# Patient Record
Sex: Male | Born: 1935 | Race: Asian | Hispanic: No | Marital: Married | Smoking: Never smoker
Health system: Southern US, Community
[De-identification: ages and names within clinical notes are randomized; demographics above are authoritative.]

## PROBLEM LIST (undated history)

## (undated) DIAGNOSIS — F0390 Unspecified dementia without behavioral disturbance: Secondary | ICD-10-CM

## (undated) DIAGNOSIS — E785 Hyperlipidemia, unspecified: Secondary | ICD-10-CM

## (undated) DIAGNOSIS — K219 Gastro-esophageal reflux disease without esophagitis: Secondary | ICD-10-CM

## (undated) DIAGNOSIS — R7401 Elevation of levels of liver transaminase levels: Secondary | ICD-10-CM

## (undated) DIAGNOSIS — R7989 Other specified abnormal findings of blood chemistry: Secondary | ICD-10-CM

## (undated) DIAGNOSIS — M109 Gout, unspecified: Secondary | ICD-10-CM

## (undated) DIAGNOSIS — R740 Nonspecific elevation of levels of transaminase and lactic acid dehydrogenase [LDH]: Secondary | ICD-10-CM

## (undated) DIAGNOSIS — R296 Repeated falls: Secondary | ICD-10-CM

## (undated) DIAGNOSIS — I509 Heart failure, unspecified: Secondary | ICD-10-CM

## (undated) HISTORY — PX: OTHER SURGICAL HISTORY: SHX169

---

## 2016-08-01 ENCOUNTER — Encounter: Payer: Self-pay | Admitting: Emergency Medicine

## 2016-08-01 ENCOUNTER — Observation Stay
Admission: EM | Admit: 2016-08-01 | Discharge: 2016-08-02 | Disposition: A | Discharge status: Home / Self Care | Payer: Federal, State, Local not specified - PPO | Attending: Internal Medicine | Admitting: Internal Medicine

## 2016-08-01 DIAGNOSIS — I110 Hypertensive heart disease with heart failure: Secondary | ICD-10-CM | POA: Insufficient documentation

## 2016-08-01 DIAGNOSIS — E876 Hypokalemia: Secondary | ICD-10-CM | POA: Insufficient documentation

## 2016-08-01 DIAGNOSIS — Z7982 Long term (current) use of aspirin: Secondary | ICD-10-CM | POA: Insufficient documentation

## 2016-08-01 DIAGNOSIS — M109 Gout, unspecified: Secondary | ICD-10-CM | POA: Insufficient documentation

## 2016-08-01 DIAGNOSIS — I340 Nonrheumatic mitral (valve) insufficiency: Secondary | ICD-10-CM | POA: Diagnosis not present

## 2016-08-01 DIAGNOSIS — Z7901 Long term (current) use of anticoagulants: Secondary | ICD-10-CM | POA: Diagnosis not present

## 2016-08-01 DIAGNOSIS — I6523 Occlusion and stenosis of bilateral carotid arteries: Secondary | ICD-10-CM | POA: Insufficient documentation

## 2016-08-01 DIAGNOSIS — K219 Gastro-esophageal reflux disease without esophagitis: Secondary | ICD-10-CM | POA: Insufficient documentation

## 2016-08-01 DIAGNOSIS — E785 Hyperlipidemia, unspecified: Secondary | ICD-10-CM | POA: Insufficient documentation

## 2016-08-01 DIAGNOSIS — M2578 Osteophyte, vertebrae: Secondary | ICD-10-CM | POA: Diagnosis not present

## 2016-08-01 DIAGNOSIS — R55 Syncope and collapse: Secondary | ICD-10-CM | POA: Diagnosis present

## 2016-08-01 DIAGNOSIS — W19XXXA Unspecified fall, initial encounter: Secondary | ICD-10-CM | POA: Insufficient documentation

## 2016-08-01 DIAGNOSIS — F0390 Unspecified dementia without behavioral disturbance: Secondary | ICD-10-CM | POA: Diagnosis not present

## 2016-08-01 DIAGNOSIS — I509 Heart failure, unspecified: Secondary | ICD-10-CM | POA: Insufficient documentation

## 2016-08-01 DIAGNOSIS — Z79899 Other long term (current) drug therapy: Secondary | ICD-10-CM | POA: Diagnosis not present

## 2016-08-01 DIAGNOSIS — I7389 Other specified peripheral vascular diseases: Secondary | ICD-10-CM | POA: Insufficient documentation

## 2016-08-01 DIAGNOSIS — F329 Major depressive disorder, single episode, unspecified: Secondary | ICD-10-CM | POA: Insufficient documentation

## 2016-08-01 DIAGNOSIS — Z951 Presence of aortocoronary bypass graft: Secondary | ICD-10-CM | POA: Insufficient documentation

## 2016-08-01 HISTORY — DX: Elevation of levels of liver transaminase levels: R74.01

## 2016-08-01 HISTORY — DX: Unspecified dementia, unspecified severity, without behavioral disturbance, psychotic disturbance, mood disturbance, and anxiety: F03.90

## 2016-08-01 HISTORY — DX: Other specified abnormal findings of blood chemistry: R79.89

## 2016-08-01 HISTORY — DX: Gastro-esophageal reflux disease without esophagitis: K21.9

## 2016-08-01 HISTORY — DX: Gout, unspecified: M10.9

## 2016-08-01 HISTORY — DX: Nonspecific elevation of levels of transaminase and lactic acid dehydrogenase (ldh): R74.0

## 2016-08-01 HISTORY — DX: Heart failure, unspecified: I50.9

## 2016-08-01 HISTORY — DX: Hyperlipidemia, unspecified: E78.5

## 2016-08-01 NOTE — ED Triage Notes (Signed)
Pt presents to ED from [facility] by EMS with c/o unwitnessed fall. Staff had checked on pt after assisting him to bed and found him on the floor next to his bed. Pt c/o "all over" pain. No obvious bruising, hematomas, or obvious injury. Pt requested to be sent for further evaluation by ED staff. Staff states pt behavior and affect are at baseline. No distress noted.  EMS vs 139/84 HR 78 98% on RA.

## 2016-08-02 ENCOUNTER — Emergency Department: Payer: Federal, State, Local not specified - PPO

## 2016-08-02 ENCOUNTER — Encounter: Payer: Self-pay | Admitting: Internal Medicine

## 2016-08-02 ENCOUNTER — Observation Stay
Admit: 2016-08-02 | Discharge: 2016-08-02 | Disposition: A | Discharge status: Home / Self Care | Payer: Federal, State, Local not specified - PPO | Attending: Internal Medicine | Admitting: Internal Medicine

## 2016-08-02 ENCOUNTER — Observation Stay: Payer: Federal, State, Local not specified - PPO

## 2016-08-02 DIAGNOSIS — R55 Syncope and collapse: Secondary | ICD-10-CM | POA: Diagnosis not present

## 2016-08-02 LAB — CBC
HCT: 30.7 % — ABNORMAL LOW (ref 40.0–52.0)
HCT: 31 % — ABNORMAL LOW (ref 40.0–52.0)
HEMOGLOBIN: 10.7 g/dL — AB (ref 13.0–18.0)
Hemoglobin: 10.8 g/dL — ABNORMAL LOW (ref 13.0–18.0)
MCH: 32.5 pg (ref 26.0–34.0)
MCH: 32.4 pg (ref 26.0–34.0)
MCHC: 35.1 g/dL (ref 32.0–36.0)
MCHC: 34.7 g/dL (ref 32.0–36.0)
MCV: 92.7 fL (ref 80.0–100.0)
MCV: 93.3 fL (ref 80.0–100.0)
PLATELETS: 211 10*3/uL (ref 150–440)
Platelets: 214 10*3/uL (ref 150–440)
RBC: 3.31 MIL/uL — AB (ref 4.40–5.90)
RBC: 3.32 MIL/uL — ABNORMAL LOW (ref 4.40–5.90)
RDW: 14.5 % (ref 11.5–14.5)
RDW: 14.2 % (ref 11.5–14.5)
WBC: 3.9 10*3/uL (ref 3.8–10.6)
WBC: 4.6 10*3/uL (ref 3.8–10.6)

## 2016-08-02 LAB — COMPREHENSIVE METABOLIC PANEL
ALBUMIN: 3.2 g/dL — AB (ref 3.5–5.0)
ALK PHOS: 41 U/L (ref 38–126)
ALT: 20 U/L (ref 17–63)
ANION GAP: 7 (ref 5–15)
AST: 35 U/L (ref 15–41)
BUN: 15 mg/dL (ref 6–20)
CALCIUM: 8.3 mg/dL — AB (ref 8.9–10.3)
CO2: 29 mmol/L (ref 22–32)
CREATININE: 1.06 mg/dL (ref 0.61–1.24)
Chloride: 103 mmol/L (ref 101–111)
GFR calc Af Amer: >60 mL/min (ref >60)
GFR calc non Af Amer: >60 mL/min (ref >60)
GLUCOSE: 89 mg/dL (ref 65–99)
Potassium: 3.1 mmol/L — ABNORMAL LOW (ref 3.5–5.1)
SODIUM: 139 mmol/L (ref 135–145)
Total Bilirubin: 0.7 mg/dL (ref 0.3–1.2)
Total Protein: 6.4 g/dL — ABNORMAL LOW (ref 6.5–8.1)

## 2016-08-02 LAB — ECHOCARDIOGRAM COMPLETE
HEIGHTINCHES: 64 in
WEIGHTICAEL: 1920 [oz_av]

## 2016-08-02 LAB — BASIC METABOLIC PANEL
Anion gap: 8 (ref 5–15)
BUN: 13 mg/dL (ref 6–20)
CO2: 28 mmol/L (ref 22–32)
CREATININE: 1.09 mg/dL (ref 0.61–1.24)
Calcium: 8.5 mg/dL — ABNORMAL LOW (ref 8.9–10.3)
Chloride: 104 mmol/L (ref 101–111)
GFR calc non Af Amer: >60 mL/min (ref >60)
GLUCOSE: 79 mg/dL (ref 65–99)
Potassium: 3.5 mmol/L (ref 3.5–5.1)
Sodium: 140 mmol/L (ref 135–145)

## 2016-08-02 LAB — MRSA PCR SCREENING: MRSA BY PCR: POSITIVE — AB

## 2016-08-02 LAB — URINALYSIS, COMPLETE (UACMP) WITH MICROSCOPIC
Bacteria, UA: NONE SEEN
Bilirubin Urine: NEGATIVE
Glucose, UA: NEGATIVE mg/dL
Hgb urine dipstick: NEGATIVE
KETONES UR: NEGATIVE mg/dL
Leukocytes, UA: NEGATIVE
Nitrite: NEGATIVE
PH: 7 (ref 5.0–8.0)
Protein, ur: NEGATIVE mg/dL
RBC / HPF: NONE SEEN RBC/hpf (ref 0–5)
SPECIFIC GRAVITY, URINE: 1.012 (ref 1.005–1.030)

## 2016-08-02 LAB — TROPONIN I
Troponin I: 0.03 ng/mL (ref <0.03)
Troponin I: 0.03 ng/mL (ref <0.03)
Troponin I: 0.03 ng/mL (ref <0.03)

## 2016-08-02 MED ORDER — DONEPEZIL HCL 5 MG PO TABS: 10.0000 mg | ORAL_TABLET | Freq: Every day | ORAL | Status: DC

## 2016-08-02 MED ORDER — SODIUM CHLORIDE 0.9% FLUSH
3.0000 mL | Freq: Two times a day (BID) | INTRAVENOUS | Status: DC
  Administered 2016-08-02: 3 mL via INTRAVENOUS

## 2016-08-02 MED ORDER — CITALOPRAM HYDROBROMIDE 20 MG PO TABS
20.0000 mg | ORAL_TABLET | Freq: Every day | ORAL | Status: DC
  Administered 2016-08-02: 20 mg via ORAL
  Filled 2016-08-02: qty 1

## 2016-08-02 MED ORDER — POTASSIUM CHLORIDE 20 MEQ PO PACK
40.0000 meq | PACK | Freq: Once | ORAL | Status: AC
  Administered 2016-08-02: 40 meq via ORAL
  Filled 2016-08-02: qty 2

## 2016-08-02 MED ORDER — PRAVASTATIN SODIUM 40 MG PO TABS
40.0000 mg | ORAL_TABLET | Freq: Every day | ORAL | Status: DC
  Administered 2016-08-02: 40 mg via ORAL
  Filled 2016-08-02: qty 1

## 2016-08-02 MED ORDER — ONDANSETRON HCL 4 MG/2ML IJ SOLN: 4.0000 mg | Freq: Four times a day (QID) | INTRAMUSCULAR | Status: DC | PRN

## 2016-08-02 MED ORDER — ACETAMINOPHEN 650 MG RE SUPP: 650.0000 mg | Freq: Four times a day (QID) | RECTAL | Status: DC | PRN

## 2016-08-02 MED ORDER — VITAMIN D 1000 UNITS PO TABS
1000.0000 [IU] | ORAL_TABLET | Freq: Every day | ORAL | Status: DC
  Administered 2016-08-02: 1000 [IU] via ORAL
  Filled 2016-08-02: qty 1

## 2016-08-02 MED ORDER — METOPROLOL SUCCINATE ER 50 MG PO TB24
50.0000 mg | ORAL_TABLET | Freq: Every day | ORAL | Status: DC
  Administered 2016-08-02: 50 mg via ORAL
  Filled 2016-08-02 (×2): qty 1

## 2016-08-02 MED ORDER — TRAZODONE HCL 100 MG PO TABS: 100.0000 mg | ORAL_TABLET | Freq: Every evening | ORAL | Status: DC | PRN

## 2016-08-02 MED ORDER — VITAMIN B-12 1000 MCG PO TABS
1000.0000 ug | ORAL_TABLET | Freq: Every day | ORAL | Status: DC
  Administered 2016-08-02: 1000 ug via ORAL
  Filled 2016-08-02: qty 1

## 2016-08-02 MED ORDER — ONDANSETRON HCL 4 MG PO TABS: 4.0000 mg | ORAL_TABLET | Freq: Four times a day (QID) | ORAL | Status: DC | PRN

## 2016-08-02 MED ORDER — CHLORHEXIDINE GLUCONATE CLOTH 2 % EX PADS: 6.0000 | MEDICATED_PAD | Freq: Every day | CUTANEOUS | Status: DC

## 2016-08-02 MED ORDER — SENNOSIDES-DOCUSATE SODIUM 8.6-50 MG PO TABS: 1.0000 | ORAL_TABLET | Freq: Every evening | ORAL | Status: DC | PRN

## 2016-08-02 MED ORDER — MUPIROCIN 2 % EX OINT
1.0000 "application " | TOPICAL_OINTMENT | Freq: Two times a day (BID) | CUTANEOUS | Status: DC
  Administered 2016-08-02: 1 via NASAL
  Filled 2016-08-02: qty 22

## 2016-08-02 MED ORDER — ENOXAPARIN SODIUM 40 MG/0.4ML ~~LOC~~ SOLN: 40.0000 mg | Freq: Every day | SUBCUTANEOUS | Status: DC

## 2016-08-02 MED ORDER — DIVALPROEX SODIUM 125 MG PO CSDR
125.0000 mg | DELAYED_RELEASE_CAPSULE | Freq: Three times a day (TID) | ORAL | Status: DC
  Administered 2016-08-02 (×2): 125 mg via ORAL
  Filled 2016-08-02 (×2): qty 1

## 2016-08-02 MED ORDER — NITROGLYCERIN 0.4 MG SL SUBL: 0.4000 mg | SUBLINGUAL_TABLET | SUBLINGUAL | Status: DC | PRN

## 2016-08-02 MED ORDER — ACETAMINOPHEN 325 MG PO TABS
650.0000 mg | ORAL_TABLET | Freq: Four times a day (QID) | ORAL | Status: DC | PRN
Start: 2016-08-02 — End: 2016-08-02

## 2016-08-02 MED ORDER — RISPERIDONE 1 MG PO TABS
1.0000 mg | ORAL_TABLET | Freq: Two times a day (BID) | ORAL | Status: DC
  Administered 2016-08-02: 1 mg via ORAL
  Filled 2016-08-02: qty 1

## 2016-08-02 MED ORDER — ASPIRIN 325 MG PO TABS
325.0000 mg | ORAL_TABLET | Freq: Every day | ORAL | Status: DC
  Administered 2016-08-02: 325 mg via ORAL
  Filled 2016-08-02: qty 1

## 2016-08-02 MED ORDER — SODIUM CHLORIDE 0.9 % IV SOLN: 250.0000 mL | INTRAVENOUS | Status: DC | PRN

## 2016-08-02 MED ORDER — MEMANTINE HCL 10 MG PO TABS
10.0000 mg | ORAL_TABLET | Freq: Two times a day (BID) | ORAL | Status: DC
  Administered 2016-08-02: 10 mg via ORAL
  Filled 2016-08-02: qty 1

## 2016-08-02 MED ORDER — SODIUM CHLORIDE 0.9% FLUSH: 3.0000 mL | INTRAVENOUS | Status: DC | PRN

## 2016-08-02 MED ORDER — PANTOPRAZOLE SODIUM 40 MG PO TBEC
40.0000 mg | DELAYED_RELEASE_TABLET | Freq: Every day | ORAL | Status: DC
  Administered 2016-08-02: 40 mg via ORAL
  Filled 2016-08-02: qty 1

## 2016-08-02 NOTE — Progress Notes (Signed)
Spoke to Flo Shanks at [facility] and told her that patient will be coming back tonight.

## 2016-08-02 NOTE — Clinical Social Work Note (Signed)
CSW was informed that patient is from [facility] unit.  CSW contacted [employer], and they said patient has Medicare and Medicaid.  CSW to continue to follow patient's progress throughout discharge planning.  Jones Broom. [place], MSW, [place]  08/02/2016 11:17 AM

## 2016-08-02 NOTE — Progress Notes (Addendum)
[facility] [place] NAME: Johnathan Whitney    MR#:  [id number]  DATE OF BIRTH:  [birth year]  SUBJECTIVE:  CHIEF COMPLAINT:   Chief Complaint  Patient presents with  . Fall   - admitted with syncopal episode - has dementia, masked face from depression likely - doesn't know what's going on  REVIEW OF SYSTEMS:  Review of Systems  Unable to perform ROS: Dementia    DRUG ALLERGIES:  No Known Allergies  VITALS:  Blood pressure (!) 141/73, pulse (!) 102, temperature 98 F (36.7 C), temperature source Oral, resp. rate 18, height 5\' 4"  (1.626 m), weight 54.4 kg (120 lb), SpO2 99 %.  PHYSICAL EXAMINATION:  Physical Exam  GENERAL:  83 y.o.-year-old patient lying in the bed with no acute distress.  EYES: Pupils equal, round, reactive to light and accommodation. No scleral icterus. Extraocular muscles intact.  HEENT: Head atraumatic, normocephalic. Oropharynx and nasopharynx clear.  NECK:  Supple, no jugular venous distention. No thyroid enlargement, no tenderness.  LUNGS: Normal breath sounds bilaterally, no wheezing, rales,rhonchi or crepitation. No use of accessory muscles of respiration. Decreased bibasilar breath sounds CARDIOVASCULAR: S1, S2 normal. No rubs, or gallops. 2/6 systolic murmur present ABDOMEN: Soft, nontender, nondistended. Bowel sounds present. No organomegaly or mass.  EXTREMITIES: No pedal edema, cyanosis, or clubbing.  NEUROLOGIC: follows simple commands, sensation intact, moving all extremities, some weakness of lower extremities. Gait not checked.  PSYCHIATRIC: The patient is alert but not oriented SKIN: No obvious rash, lesion, or ulcer.    LABORATORY PANEL:   CBC  Recent Labs Lab 08/02/16 0445  WBC 3.9  HGB 10.8*  HCT 31.0*  PLT 211   ------------------------------------------------------------------------------------------------------------------  Chemistries   Recent Labs Lab 08/02/16 0049  08/02/16 0445  NA 139 140  K 3.1* 3.5  CL 103 104  CO2 29 28  GLUCOSE 89 79  BUN 15 13  CREATININE 1.06 1.09  CALCIUM 8.3* 8.5*  AST 35  --   ALT 20  --   ALKPHOS 41  --   BILITOT 0.7  --    ------------------------------------------------------------------------------------------------------------------  Cardiac Enzymes  Recent Labs Lab 08/02/16 1016  TROPONINI 0.03*   ------------------------------------------------------------------------------------------------------------------  RADIOLOGY:  Dg Pelvis 1-2 Views  Result Date: 08/02/2016 CLINICAL DATA:  Unwitnessed fall, noncommunicative. EXAM: PELVIS - 1-2 VIEW COMPARISON:  None. FINDINGS: There is no evidence of pelvic fracture or diastasis. No pelvic bone lesions are seen. Moderate vascular calcifications. IMPRESSION: Negative. Electronically Signed   By: Elon Alas M.D.   On: 08/02/2016 00:58   Ct Head Wo Contrast  Result Date: 08/02/2016 CLINICAL DATA:  Status post unwitnessed fall. Diffuse head neck pain. Initial encounter. EXAM: CT HEAD WITHOUT CONTRAST CT CERVICAL SPINE WITHOUT CONTRAST TECHNIQUE: Multidetector CT imaging of the head and cervical spine was performed following the standard protocol without intravenous contrast. Multiplanar CT image reconstructions of the cervical spine were also generated. COMPARISON:  None. FINDINGS: CT HEAD FINDINGS Brain: No evidence of acute infarction, hemorrhage, hydrocephalus, extra-axial collection or mass lesion/mass effect. Prominence of ventricles and sulci reflects mild to moderate cortical volume loss. Scattered periventricular and subcortical white matter change likely reflects small vessel ischemic microangiopathy. Cerebellar atrophy is noted. The brainstem and fourth ventricle are within normal limits. The basal ganglia are unremarkable in appearance. The cerebral hemispheres demonstrate grossly normal gray-white differentiation. No mass effect or midline shift is seen.  Vascular: No hyperdense vessel or unexpected calcification. Skull: There is no evidence of  fracture; visualized osseous structures are unremarkable in appearance. Sinuses/Orbits: The visualized portions of the orbits are within normal limits. The paranasal sinuses and mastoid air cells are well-aerated. Other: No significant soft tissue abnormalities are seen. CT CERVICAL SPINE FINDINGS Alignment: Normal. Skull base and vertebrae: No acute fracture. No primary bone lesion or focal pathologic process. Soft tissues and spinal canal: No prevertebral fluid or swelling. No visible canal hematoma. Disc levels: Multilevel disc space narrowing is noted along the cervical spine, with scattered anterior and posterior disc osteophyte complexes. Degenerative change is noted about the dens. Upper chest: The visualized portions of the thyroid gland are unremarkable. The visualized lung apices are grossly clear. Calcification is seen at the carotid bifurcations bilaterally. Other: No additional soft tissue abnormalities are seen. IMPRESSION: 1. No evidence of traumatic intracranial injury or fracture. 2. No evidence of fracture or subluxation along the cervical spine. 3. Mild to moderate cortical volume loss and scattered small vessel ischemic microangiopathy. 4. Mild diffuse degenerative change along the cervical spine. 5. Calcification at the carotid bifurcations bilaterally. Carotid ultrasound would be helpful for further evaluation, when and as deemed clinically appropriate. Electronically Signed   By: Garald Balding M.D.   On: 08/02/2016 00:43   Ct Cervical Spine Wo Contrast  Result Date: 08/02/2016 CLINICAL DATA:  Status post unwitnessed fall. Diffuse head neck pain. Initial encounter. EXAM: CT HEAD WITHOUT CONTRAST CT CERVICAL SPINE WITHOUT CONTRAST TECHNIQUE: Multidetector CT imaging of the head and cervical spine was performed following the standard protocol without intravenous contrast. Multiplanar CT image  reconstructions of the cervical spine were also generated. COMPARISON:  None. FINDINGS: CT HEAD FINDINGS Brain: No evidence of acute infarction, hemorrhage, hydrocephalus, extra-axial collection or mass lesion/mass effect. Prominence of ventricles and sulci reflects mild to moderate cortical volume loss. Scattered periventricular and subcortical white matter change likely reflects small vessel ischemic microangiopathy. Cerebellar atrophy is noted. The brainstem and fourth ventricle are within normal limits. The basal ganglia are unremarkable in appearance. The cerebral hemispheres demonstrate grossly normal gray-white differentiation. No mass effect or midline shift is seen. Vascular: No hyperdense vessel or unexpected calcification. Skull: There is no evidence of fracture; visualized osseous structures are unremarkable in appearance. Sinuses/Orbits: The visualized portions of the orbits are within normal limits. The paranasal sinuses and mastoid air cells are well-aerated. Other: No significant soft tissue abnormalities are seen. CT CERVICAL SPINE FINDINGS Alignment: Normal. Skull base and vertebrae: No acute fracture. No primary bone lesion or focal pathologic process. Soft tissues and spinal canal: No prevertebral fluid or swelling. No visible canal hematoma. Disc levels: Multilevel disc space narrowing is noted along the cervical spine, with scattered anterior and posterior disc osteophyte complexes. Degenerative change is noted about the dens. Upper chest: The visualized portions of the thyroid gland are unremarkable. The visualized lung apices are grossly clear. Calcification is seen at the carotid bifurcations bilaterally. Other: No additional soft tissue abnormalities are seen. IMPRESSION: 1. No evidence of traumatic intracranial injury or fracture. 2. No evidence of fracture or subluxation along the cervical spine. 3. Mild to moderate cortical volume loss and scattered small vessel ischemic microangiopathy.  4. Mild diffuse degenerative change along the cervical spine. 5. Calcification at the carotid bifurcations bilaterally. Carotid ultrasound would be helpful for further evaluation, when and as deemed clinically appropriate. Electronically Signed   By: Garald Balding M.D.   On: 08/02/2016 00:43   US Carotid Bilateral  Result Date: 08/02/2016 CLINICAL DATA:  Intermittent syncope x2 weeks EXAM: BILATERAL CAROTID  DUPLEX ULTRASOUND TECHNIQUE: Pearline Cables scale imaging, color Doppler and duplex ultrasound was performed of bilateral carotid and vertebral arteries in the neck. COMPARISON:  None. TECHNIQUE: Quantification of carotid stenosis is based on velocity parameters that correlate the residual internal carotid diameter with NASCET-based stenosis levels, using the diameter of the distal internal carotid lumen as the denominator for stenosis measurement. The following velocity measurements were obtained: PEAK SYSTOLIC/END DIASTOLIC RIGHT ICA:                     88/19cm/sec CCA:                     [id number] SYSTOLIC ICA/CCA RATIO:  1.4 DIASTOLIC ICA/CCA RATIO: 1.9 ECA:                     67cm/sec LEFT ICA:                     104/21cm/sec CCA:                     [id number] SYSTOLIC ICA/CCA RATIO:  1.3 DIASTOLIC ICA/CCA RATIO: 1.7 ECA:                     97cm/sec FINDINGS: RIGHT CAROTID ARTERY: Eccentric partially calcified plaque in the distal common carotid artery and bulb without high-grade stenosis. Normal waveforms and color Doppler signal. ICA tortuous. RIGHT VERTEBRAL ARTERY:  Normal flow direction and waveform. LEFT CAROTID ARTERY: Eccentric partially calcified plaque in the bulb and proximal ICA. No high-grade stenosis. Normal waveforms and color Doppler signal. LEFT VERTEBRAL ARTERY: Normal flow direction and waveform. IMPRESSION: 1. Bilateral carotid bifurcation and proximal ICA plaque resulting in less than 50% diameter stenosis. 2. Antegrade bilateral vertebral arterial flow. Electronically Signed   By: Lucrezia Europe M.D.   On: 08/02/2016 10:56   Dg Chest Port 1 View  Result Date: 08/02/2016 CLINICAL DATA:  Unwitnessed fall, noncommunicative.  History CHF. EXAM: PORTABLE CHEST 1 VIEW COMPARISON:  None. FINDINGS: Cardiac silhouette is mildly enlarged. Status post median sternotomy for CABG. Heavily calcified aortic knob. No pleural effusion or focal consolidation. Minimal bibasilar atelectasis. No pneumothorax. Soft tissue planes and included osseous structures are nonsuspicious. IMPRESSION: Mild cardiomegaly, s/p CABG.  Minimal bibasilar atelectasis. Electronically Signed   By: Elon Alas M.D.   On: 08/02/2016 00:57    EKG:   Orders placed or performed during the hospital encounter of 08/01/16  . EKG 12-Lead  . EKG 12-Lead    ASSESSMENT AND PLAN:   83 y/o M with PMH of CHF, dementia, depression, GERD, gout, hyperlipidemia From dementia unit of [facility] presented to the hospital secondary to syncopal episode.  #1 syncope-unwitnessed, could be vasovagal. -Monitored on telemetry, troponins are stable. No arrhythmias noted. -CT of the head without any acute abnormalities. Carotid Dopplers with no stenosis. -Echocardiogram is pending. -Physical therapy consult is pending. Patient ambulates with a walker at baseline. -Urine analysis negative for any infection.  #2 depression-patient was at [facility] facility last month. Continue  Depakote, Celexa.  #3 dementia-severe dementia at baseline. Has been at the dementia facility for 2 weeks now. Continue Aricept and Namenda. -Also on risperidone twice a day  #4 hypertension-on Toprol  #5 DVT prophylaxis-on Lovenox   Physical therapy consult pending. Anticipate discharge today or tomorrow. Discussed plan with wife and patients step daughter Ms. Lavella Lemons   All the records are reviewed and case  discussed with Care Management/Social Workerr. Management plans discussed with the patient, family and they are in  agreement.  CODE STATUS: Full Code  TOTAL TIME TAKING CARE OF THIS PATIENT: 38 minutes.   POSSIBLE D/C IN 1-2 DAYS, DEPENDING ON CLINICAL CONDITION.   Gladstone Lighter M.D on 08/02/2016 at 2:38 PM  Between 7am to 6pm - Pager - [phone]  After 6pm go to [email or web address] - password [email or web address] [facility]  Office  [phone]  CC: Primary care physician; [place]

## 2016-08-02 NOTE — NC FL2 (Signed)
[place] LEVEL OF CARE SCREENING TOOL     IDENTIFICATION  Patient Name: Johnathan Whitney Birthdate: [birth year] Sex: male Admission Date (Current Location): 08/01/2016  [place] and [place] Number:  Engineering geologist and Address:  [facility], [address], [place], [place] [address]      Provider Number: [id number]  Attending Physician Name and Address:  Gladstone Lighter, MD  Relative Name and Phone Number:  Phil Dopp  [phone]     Current Level of Care: Hospital Recommended Level of Care: [facility] Prior Approval Number:    Date Approved/Denied:   PASRR Number:    Discharge Plan: Domiciliary (Rest home) ([facility])    Current Diagnoses: Patient Active Problem List   Diagnosis Date Noted  . Syncope and collapse 08/02/2016  . Syncope 08/02/2016    Orientation RESPIRATION BLADDER Height & Weight     Self  Normal Continent Weight: 120 lb (54.4 kg) Height:  5\' 4"  (162.6 cm)  BEHAVIORAL SYMPTOMS/MOOD NEUROLOGICAL BOWEL NUTRITION STATUS      Continent Diet (2G sodium diet)  AMBULATORY STATUS COMMUNICATION OF NEEDS Skin   Limited Assist Verbally Normal                       Personal Care Assistance Level of Assistance  Bathing, Feeding, Dressing Bathing Assistance: Limited assistance Feeding assistance: Limited assistance Dressing Assistance: Limited assistance     Functional Limitations Info  Hearing, Speech, Sight Sight Info: Adequate Hearing Info: Adequate Speech Info: Adequate    SPECIAL CARE FACTORS FREQUENCY                       Contractures Contractures Info: Not present    Additional Factors Info  Code Status, Allergies, Psychotropic Code Status Info: Full Code Allergies Info: NKA Psychotropic Info: citalopram (CELEXA) tablet 20 mg risperiDONE (RISPERDAL) tablet 1 mg         Current Medications (08/02/2016):  This is the current  hospital active medication list Current Facility-Administered Medications  Medication Dose Route Frequency Provider Last Rate Last Dose  . 0.9 %  sodium chloride infusion  250 mL Intravenous PRN Saundra Shelling, MD      . acetaminophen (TYLENOL) tablet 650 mg  650 mg Oral Q6H PRN Saundra Shelling, MD       Or  . acetaminophen (TYLENOL) suppository 650 mg  650 mg Rectal Q6H PRN Saundra Shelling, MD      . aspirin tablet 325 mg  325 mg Oral Daily Saundra Shelling, MD   325 mg at 08/02/16 1041  . Chlorhexidine Gluconate Cloth 2 % PADS 6 each  6 each Topical Q0600 Pavan Pyreddy, MD      . cholecalciferol (VITAMIN D) tablet 1,000 Units  1,000 Units Oral Daily Saundra Shelling, MD   1,000 Units at 08/02/16 1041  . citalopram (CELEXA) tablet 20 mg  20 mg Oral Daily Saundra Shelling, MD   20 mg at 08/02/16 1039  . divalproex (DEPAKOTE SPRINKLE) capsule 125 mg  125 mg Oral TID Saundra Shelling, MD   125 mg at 08/02/16 1040  . donepezil (ARICEPT) tablet 10 mg  10 mg Oral QHS Pavan Pyreddy, MD      . enoxaparin (LOVENOX) injection 40 mg  40 mg Subcutaneous QHS Lenis Noon, [facility]      . memantine (NAMENDA) tablet 10 mg  10 mg Oral BID Saundra Shelling, MD  10 mg at 08/02/16 1040  . metoprolol succinate (TOPROL-XL) 24 hr tablet 50 mg  50 mg Oral Daily Saundra Shelling, MD   50 mg at 08/02/16 1048  . mupirocin ointment (BACTROBAN) 2 % 1 application  1 application Nasal BID Saundra Shelling, MD   1 application at 0000000 1049  . nitroGLYCERIN (NITROSTAT) SL tablet 0.4 mg  0.4 mg Sublingual Q5 min PRN Pavan Pyreddy, MD      . ondansetron (ZOFRAN) tablet 4 mg  4 mg Oral Q6H PRN Saundra Shelling, MD       Or  . ondansetron (ZOFRAN) injection 4 mg  4 mg Intravenous Q6H PRN Pavan Pyreddy, MD      . pantoprazole (PROTONIX) EC tablet 40 mg  40 mg Oral QAC breakfast Saundra Shelling, MD   40 mg at 08/02/16 1039  . pravastatin (PRAVACHOL) tablet 40 mg  40 mg Oral Daily Saundra Shelling, MD   40 mg at 08/02/16 1039  . risperiDONE (RISPERDAL) tablet 1 mg   1 mg Oral BID Saundra Shelling, MD   1 mg at 08/02/16 1041  . senna-docusate (Senokot-S) tablet 1 tablet  1 tablet Oral QHS PRN Pavan Pyreddy, MD      . sodium chloride flush (NS) 0.9 % injection 3 mL  3 mL Intravenous Q12H Pavan Pyreddy, MD   3 mL at 08/02/16 1049  . sodium chloride flush (NS) 0.9 % injection 3 mL  3 mL Intravenous Q12H Pavan Pyreddy, MD   3 mL at 08/02/16 1048  . sodium chloride flush (NS) 0.9 % injection 3 mL  3 mL Intravenous PRN Pavan Pyreddy, MD      . traZODone (DESYREL) tablet 100 mg  100 mg Oral QHS PRN Saundra Shelling, MD      . vitamin B-12 (CYANOCOBALAMIN) tablet 1,000 mcg  1,000 mcg Oral Daily Saundra Shelling, MD   1,000 mcg at 08/02/16 1042     Discharge Medications: Please see discharge summary for a list of discharge medications.  Relevant Imaging Results:  Relevant Lab Results:   Additional Information    [place], Jones Broom, LCSWA

## 2016-08-02 NOTE — Care Management (Signed)
patient to return to [facility].  Obtained order for home health physical therapy and [facility].

## 2016-08-02 NOTE — ED Notes (Signed)
Dr. Brown at the bedside for pt evaluation 

## 2016-08-02 NOTE — ED Provider Notes (Signed)
[facility] Emergency Department Provider Note    First MD Initiated Contact with Patient 08/02/16 0004     (approximate)  I have reviewed the triage vital signs and the nursing notes.   HISTORY  Chief Complaint Fall    HPI Johnathan Whitney is a 83 y.o. male with below of chronic medical conditions presents emergency department status post unwitnessed fall from [facility]. Per EMS the patient initially complained of generalized pain however patient now complaining of posterior neck pain.   Past Medical History:  Diagnosis Date  . CHF (congestive heart failure) ([place])   . Dementia   . Elevated AST (SGOT)   . Elevated serum creatinine   . GERD (gastroesophageal reflux disease)   . Gout   . Hyperlipidemia     There are no active problems to display for this patient.   No past surgical history on file.  Prior to Admission medications   Not on File    Allergies No known drug allergies No family history on file.  Social History Social History  Substance Use Topics  . Smoking status: Never Smoker  . Smokeless tobacco: Never Used  . Alcohol use No    Review of Systems Constitutional: No fever/chills Eyes: No visual changes. ENT: No sore throat. Cardiovascular: Denies chest pain. Respiratory: Denies shortness of breath. Gastrointestinal: No abdominal pain.  No nausea, no vomiting.  No diarrhea.  No constipation. Genitourinary: Negative for dysuria. Musculoskeletal: Negative for back pain.Positive for posterior neck pain. Skin: Negative for rash. Neurological: Negative for headaches, focal weakness or numbness.  10-point ROS otherwise negative.  ____________________________________________   PHYSICAL EXAM:  VITAL SIGNS: ED Triage Vitals [08/01/16 2357]  Enc Vitals Group     BP      Pulse Rate (!) 57     Resp 18     Temp 98.7 F (37.1 C)     Temp Source Oral     SpO2 98 %     Weight 120 lb (54.4 kg)     Height 5'  4" (1.626 m)     Head Circumference      Peak Flow      Pain Score      Pain Loc      Pain Edu?      Excl. in [place]?     Constitutional: Alert and oriented. Well appearing and in no acute distress. Eyes: Conjunctivae are normal. PERRL. EOMI. Head: Atraumatic. Ears:  Healthy appearing ear canals and TMs bilaterally Nose: No congestion/rhinnorhea. Mouth/Throat: Mucous membranes are moist.  Oropharynx non-erythematous. Neck: No stridor.  C4-6 pain with palpation Cardiovascular: Normal rate, regular rhythm. Good peripheral circulation. Grossly normal heart sounds.Systolic ejection murmur Respiratory: Normal respiratory effort.  No retractions. Lungs CTAB. Gastrointestinal: Soft and nontender. No distention.  Musculoskeletal: No lower extremity tenderness nor edema. No gross deformities of extremities. Neurologic:  Normal speech and language. No gross focal neurologic deficits are appreciated.  Skin:  Skin is warm, dry and intact. No rash noted. Psychiatric: Mood and affect are normal. Speech and behavior are normal.  ____________________________________________   LABS (all labs ordered are listed, but only abnormal results are displayed)  Labs Reviewed  BASIC METABOLIC PANEL  CBC  TROPONIN I   ____________________________________________  EKG  ED ECG REPORT I, [place], the attending physician, personally viewed and interpreted this ECG.   Date: 08/02/2016  EKG Time: 11:32 PM  Rate: 110  Rhythm: Sinus tachycardia  Axis: Normal  Intervals:  Normal  ST&T Change: None      Procedures      INITIAL IMPRESSION / ASSESSMENT AND PLAN / ED COURSE  Pertinent labs & imaging results that were available during my care of the patient were reviewed by me and considered in my medical decision making (see chart for details).        ____________________________________________  FINAL CLINICAL IMPRESSION(S) / ED DIAGNOSES  Final diagnoses:  Syncope      MEDICATIONS GIVEN DURING THIS VISIT:  Medications - No data to display   NEW OUTPATIENT MEDICATIONS STARTED DURING THIS VISIT:  New Prescriptions   No medications on file    Modified Medications   No medications on file    Discontinued Medications   No medications on file     Note:  This document was prepared using Dragon voice recognition software and may include unintentional dictation errors.    Gregor Hams, MD 08/07/16 [id number]

## 2016-08-02 NOTE — H&P (Signed)
[facility] NAME: Johnathan Whitney    MR#:  [id number]  DATE OF BIRTH:  03-Nov-1931  DATE OF ADMISSION:  08/01/2016  PRIMARY CARE PHYSICIAN: [facility]   REQUESTING/REFERRING PHYSICIAN:   CHIEF COMPLAINT:   Chief Complaint  Patient presents with  . Fall    HISTORY OF PRESENT ILLNESS: Johnathan Whitney  is a 83 y.o. male with a known history of Congestive heart failure, dementia, GERD, gout, hyperlipidemia is a resident of a [facility]. Patient was transferred to the emergency room for evaluation of syncope. Patient passed out at [place] was unwitnessed. No history of any head injury. Patient has dementia and not a great historian. She is awake but not oriented to time place and person. Not much history could be obtained from the patient. She was evaluated with a CT head which showed no acute intracranial abnormality. She also had a CT cervical spine which showed no fracture. Hospitalist service was consulted for further care of the patient. EKG normal sinus rhythm. First set of troponin is borderline.  PAST MEDICAL HISTORY:   Past Medical History:  Diagnosis Date  . CHF (congestive heart failure) ([place])   . Dementia   . Elevated AST (SGOT)   . Elevated serum creatinine   . GERD (gastroesophageal reflux disease)   . Gout   . Hyperlipidemia   . Hyperlipidemia     PAST SURGICAL HISTORY: Past Surgical History:  Procedure Laterality Date  . none      SOCIAL HISTORY:  Social History  Substance Use Topics  . Smoking status: Never Smoker  . Smokeless tobacco: Never Used  . Alcohol use No    FAMILY HISTORY:  Family History  Problem Relation Age of Onset  . Diabetes Neg Hx   . Hypertension Neg Hx     DRUG ALLERGIES: No Known Allergies  REVIEW OF SYSTEMS:  Could not be obtained secondary to patient's dementia. MEDICATIONS AT HOME:  Prior to Admission medications   Medication Sig  Start Date End Date Taking? Authorizing Provider  aspirin 325 MG tablet Take 325 mg by mouth daily.   Yes Historical Provider, MD  cholecalciferol (VITAMIN D) 1000 units tablet Take 1,000 Units by mouth daily.   Yes Historical Provider, MD  citalopram (CELEXA) 20 MG tablet Take 20 mg by mouth daily.   Yes Historical Provider, MD  divalproex (DEPAKOTE SPRINKLE) 125 MG capsule Take 125 mg by mouth 3 (three) times daily.   Yes Historical Provider, MD  donepezil (ARICEPT) 10 MG tablet Take 10 mg by mouth at bedtime.   Yes Historical Provider, MD  esomeprazole (NEXIUM) 40 MG capsule Take 40 mg by mouth at bedtime.   Yes Historical Provider, MD  memantine (NAMENDA) 10 MG tablet Take 10 mg by mouth 2 (two) times daily.   Yes Historical Provider, MD  metoprolol succinate (TOPROL-XL) 50 MG 24 hr tablet Take 50 mg by mouth daily. Take with or immediately following a meal.   Yes Historical Provider, MD  nitroGLYCERIN (NITROSTAT) 0.4 MG SL tablet Place 0.4 mg under the tongue every 5 (five) minutes as needed for chest pain.   Yes Historical Provider, MD  pravastatin (PRAVACHOL) 40 MG tablet Take 40 mg by mouth daily.   Yes Historical Provider, MD  risperiDONE (RISPERDAL) 1 MG tablet Take 1 mg by mouth 2 (two) times daily.   Yes Historical Provider, MD  traZODone (DESYREL) 100 MG tablet Take 100  mg by mouth at bedtime as needed for sleep.   Yes Historical Provider, MD  vitamin B-12 (CYANOCOBALAMIN) 1000 MCG tablet Take 1,000 mcg by mouth daily.   Yes Historical Provider, MD      PHYSICAL EXAMINATION:   VITAL SIGNS: Blood pressure (!) 207/88, pulse (!) 59, temperature 98.7 F (37.1 C), temperature source Oral, resp. rate 14, height 5\' 4"  (1.626 m), weight 54.4 kg (120 lb), SpO2 99 %.  GENERAL:  83 y.o.-year-old patient lying in the bed with no acute distress.  EYES: Pupils equal, round, reactive to light and accommodation. No scleral icterus. Extraocular muscles intact.  HEENT: Head atraumatic,  normocephalic. Oropharynx and nasopharynx clear.  NECK:  Supple, no jugular venous distention. No thyroid enlargement, no tenderness.  LUNGS: Normal breath sounds bilaterally, no wheezing, rales,rhonchi or crepitation. No use of accessory muscles of respiration.  CARDIOVASCULAR: S1, S2 normal. No murmurs, rubs, or gallops.  ABDOMEN: Soft, nontender, nondistended. Bowel sounds present. No organomegaly or mass.  EXTREMITIES: No pedal edema, cyanosis, or clubbing.  NEUROLOGIC: Cranial nerves II through XII are intact. Muscle strength 5/5 in all extremities. Sensation intact. Gait not checked. Moves all extremities. PSYCHIATRIC: could not be assessed.  SKIN: No obvious rash, lesion, or ulcer.   LABORATORY PANEL:   CBC  Recent Labs Lab 08/02/16 0049  WBC 4.6  HGB 10.7*  HCT 30.7*  PLT 214  MCV 92.7  MCH 32.5  MCHC 35.1  RDW 14.5   ------------------------------------------------------------------------------------------------------------------  Chemistries   Recent Labs Lab 08/02/16 0049  NA 139  K 3.1*  CL 103  CO2 29  GLUCOSE 89  BUN 15  CREATININE 1.06  CALCIUM 8.3*  AST 35  ALT 20  ALKPHOS 41  BILITOT 0.7   ------------------------------------------------------------------------------------------------------------------ estimated creatinine clearance is 41.3 mL/min (by C-G formula based on SCr of 1.06 mg/dL). ------------------------------------------------------------------------------------------------------------------ No results for input(s): TSH, T4TOTAL, T3FREE, THYROIDAB in the last 72 hours.  Invalid input(s): FREET3   Coagulation profile No results for input(s): INR, PROTIME in the last 168 hours. ------------------------------------------------------------------------------------------------------------------- No results for input(s): DDIMER in the last 72  hours. -------------------------------------------------------------------------------------------------------------------  Cardiac Enzymes  Recent Labs Lab 08/02/16 0049  TROPONINI 0.03*   ------------------------------------------------------------------------------------------------------------------ Invalid input(s): POCBNP  ---------------------------------------------------------------------------------------------------------------  Urinalysis No results found for: COLORURINE, APPEARANCEUR, LABSPEC, PHURINE, GLUCOSEU, HGBUR, BILIRUBINUR, KETONESUR, PROTEINUR, UROBILINOGEN, NITRITE, LEUKOCYTESUR   RADIOLOGY: Dg Pelvis 1-2 Views  Result Date: 08/02/2016 CLINICAL DATA:  Unwitnessed fall, noncommunicative. EXAM: PELVIS - 1-2 VIEW COMPARISON:  None. FINDINGS: There is no evidence of pelvic fracture or diastasis. No pelvic bone lesions are seen. Moderate vascular calcifications. IMPRESSION: Negative. Electronically Signed   By: Elon Alas M.D.   On: 08/02/2016 00:58   Ct Head Wo Contrast  Result Date: 08/02/2016 CLINICAL DATA:  Status post unwitnessed fall. Diffuse head neck pain. Initial encounter. EXAM: CT HEAD WITHOUT CONTRAST CT CERVICAL SPINE WITHOUT CONTRAST TECHNIQUE: Multidetector CT imaging of the head and cervical spine was performed following the standard protocol without intravenous contrast. Multiplanar CT image reconstructions of the cervical spine were also generated. COMPARISON:  None. FINDINGS: CT HEAD FINDINGS Brain: No evidence of acute infarction, hemorrhage, hydrocephalus, extra-axial collection or mass lesion/mass effect. Prominence of ventricles and sulci reflects mild to moderate cortical volume loss. Scattered periventricular and subcortical white matter change likely reflects small vessel ischemic microangiopathy. Cerebellar atrophy is noted. The brainstem and fourth ventricle are within normal limits. The basal ganglia are unremarkable in appearance. The  cerebral hemispheres demonstrate grossly normal gray-white differentiation. No mass  effect or midline shift is seen. Vascular: No hyperdense vessel or unexpected calcification. Skull: There is no evidence of fracture; visualized osseous structures are unremarkable in appearance. Sinuses/Orbits: The visualized portions of the orbits are within normal limits. The paranasal sinuses and mastoid air cells are well-aerated. Other: No significant soft tissue abnormalities are seen. CT CERVICAL SPINE FINDINGS Alignment: Normal. Skull base and vertebrae: No acute fracture. No primary bone lesion or focal pathologic process. Soft tissues and spinal canal: No prevertebral fluid or swelling. No visible canal hematoma. Disc levels: Multilevel disc space narrowing is noted along the cervical spine, with scattered anterior and posterior disc osteophyte complexes. Degenerative change is noted about the dens. Upper chest: The visualized portions of the thyroid gland are unremarkable. The visualized lung apices are grossly clear. Calcification is seen at the carotid bifurcations bilaterally. Other: No additional soft tissue abnormalities are seen. IMPRESSION: 1. No evidence of traumatic intracranial injury or fracture. 2. No evidence of fracture or subluxation along the cervical spine. 3. Mild to moderate cortical volume loss and scattered small vessel ischemic microangiopathy. 4. Mild diffuse degenerative change along the cervical spine. 5. Calcification at the carotid bifurcations bilaterally. Carotid ultrasound would be helpful for further evaluation, when and as deemed clinically appropriate. Electronically Signed   By: Garald Balding M.D.   On: 08/02/2016 00:43   Ct Cervical Spine Wo Contrast  Result Date: 08/02/2016 CLINICAL DATA:  Status post unwitnessed fall. Diffuse head neck pain. Initial encounter. EXAM: CT HEAD WITHOUT CONTRAST CT CERVICAL SPINE WITHOUT CONTRAST TECHNIQUE: Multidetector CT imaging of the head and  cervical spine was performed following the standard protocol without intravenous contrast. Multiplanar CT image reconstructions of the cervical spine were also generated. COMPARISON:  None. FINDINGS: CT HEAD FINDINGS Brain: No evidence of acute infarction, hemorrhage, hydrocephalus, extra-axial collection or mass lesion/mass effect. Prominence of ventricles and sulci reflects mild to moderate cortical volume loss. Scattered periventricular and subcortical white matter change likely reflects small vessel ischemic microangiopathy. Cerebellar atrophy is noted. The brainstem and fourth ventricle are within normal limits. The basal ganglia are unremarkable in appearance. The cerebral hemispheres demonstrate grossly normal gray-white differentiation. No mass effect or midline shift is seen. Vascular: No hyperdense vessel or unexpected calcification. Skull: There is no evidence of fracture; visualized osseous structures are unremarkable in appearance. Sinuses/Orbits: The visualized portions of the orbits are within normal limits. The paranasal sinuses and mastoid air cells are well-aerated. Other: No significant soft tissue abnormalities are seen. CT CERVICAL SPINE FINDINGS Alignment: Normal. Skull base and vertebrae: No acute fracture. No primary bone lesion or focal pathologic process. Soft tissues and spinal canal: No prevertebral fluid or swelling. No visible canal hematoma. Disc levels: Multilevel disc space narrowing is noted along the cervical spine, with scattered anterior and posterior disc osteophyte complexes. Degenerative change is noted about the dens. Upper chest: The visualized portions of the thyroid gland are unremarkable. The visualized lung apices are grossly clear. Calcification is seen at the carotid bifurcations bilaterally. Other: No additional soft tissue abnormalities are seen. IMPRESSION: 1. No evidence of traumatic intracranial injury or fracture. 2. No evidence of fracture or subluxation along  the cervical spine. 3. Mild to moderate cortical volume loss and scattered small vessel ischemic microangiopathy. 4. Mild diffuse degenerative change along the cervical spine. 5. Calcification at the carotid bifurcations bilaterally. Carotid ultrasound would be helpful for further evaluation, when and as deemed clinically appropriate. Electronically Signed   By: Garald Balding M.D.   On: 08/02/2016 00:43  Dg Chest Port 1 View  Result Date: 08/02/2016 CLINICAL DATA:  Unwitnessed fall, noncommunicative.  History CHF. EXAM: PORTABLE CHEST 1 VIEW COMPARISON:  None. FINDINGS: Cardiac silhouette is mildly enlarged. Status post median sternotomy for CABG. Heavily calcified aortic knob. No pleural effusion or focal consolidation. Minimal bibasilar atelectasis. No pneumothorax. Soft tissue planes and included osseous structures are nonsuspicious. IMPRESSION: Mild cardiomegaly, s/p CABG.  Minimal bibasilar atelectasis. Electronically Signed   By: Elon Alas M.D.   On: 08/02/2016 00:57    EKG: Orders placed or performed during the hospital encounter of 08/01/16  . EKG 12-Lead  . EKG 12-Lead  . ED EKG  . ED EKG  . ED EKG  . ED EKG    IMPRESSION AND PLAN: 83 year old elderly male patient with history of dementia, congestive heart failure, gout, GERD, hyperlipidemia presented to the emergency room after she fell and passed out at [place]. Admitting diagnosis 1. Syncope and collapse 2. Hypokalemia 3. Advanced dementia 4. Hyperlipidemia Treatment plan Admit patient to observation bed Cycle troponin to rule out ischemia Replace potassium orally Check echocardiogram Telemetry monitoring for any arrhythmia Resume Namenda and Aricept for dementia Supportive care  All the records are reviewed and case discussed with ED provider. Management plans discussed with the patient, family and they are in agreement.  CODE STATUS:FULL CODE Code Status History    This patient does not  have a recorded code status. Please follow your organizational policy for patients in this situation.       TOTAL TIME TAKING CARE OF THIS PATIENT: 50 minutes.    Saundra Shelling M.D on 08/02/2016 at 3:23 AM  Between 7am to 6pm - Pager - [phone]  After 6pm go to [email or web address] - password [email or web address] [facility]  [facility]  Office  [phone]  CC: Primary care physician; [place]

## 2016-08-02 NOTE — Progress Notes (Signed)
*  PRELIMINARY RESULTS* Echocardiogram 2D Echocardiogram has been performed.  [name] 08/02/2016, 3:20 PM

## 2016-08-02 NOTE — Discharge Summary (Signed)
[facility] [place] NAME: Johnathan Whitney    MR#:  [id number]  DATE OF BIRTH:  11-Nov-1931  DATE OF ADMISSION:  08/01/2016   ADMITTING PHYSICIAN: Saundra Shelling, MD  DATE OF DISCHARGE: 08/02/2016  PRIMARY CARE PHYSICIAN: [facility]   ADMISSION DIAGNOSIS:   Fall  DISCHARGE DIAGNOSIS:   Principal Problem:   Syncope and collapse Active Problems:   Syncope   SECONDARY DIAGNOSIS:   Past Medical History:  Diagnosis Date  . CHF (congestive heart failure) ([employer])   . Dementia   . Elevated AST (SGOT)   . Elevated serum creatinine   . GERD (gastroesophageal reflux disease)   . Gout   . Hyperlipidemia   . Hyperlipidemia     HOSPITAL COURSE:   83 y/o M with PMH of CHF, dementia, depression, GERD, gout, hyperlipidemia From dementia unit of [facility] presented to the hospital secondary to syncopal episode.  #1 syncope-unwitnessed, could be vasovagal. -Monitored on telemetry, troponins are stable. No arrhythmias noted. -CT of the head without any acute abnormalities. Carotid Dopplers with no stenosis. -Echocardiogram is done and report pending. -Physical therapy consult recommended rehab. Patient ambulates with a walker at baseline. -Urine analysis negative for any infection.  #2 depression-patient was at [facility] last month. Continue  Depakote, Celexa.  #3 dementia-severe dementia at baseline. Has been at the dementia facility for 2 weeks now. Continue Aricept and Namenda. -Also on risperidone twice a day  #4 hypertension-on Toprol  Assisted Living Facility can take him back, so likely discharge today Discussed plan with wife and patients step daughter Ms. Johnathan Whitney  DISCHARGE CONDITIONS:   Guarded  CONSULTS OBTAINED:   None  DRUG ALLERGIES:   No Known Allergies DISCHARGE MEDICATIONS:   Allergies as of 08/02/2016   No Known Allergies     Medication List      TAKE these medications   aspirin 325 MG tablet Take 325 mg by mouth daily.   cholecalciferol 1000 units tablet Commonly known as:  VITAMIN D Take 1,000 Units by mouth daily.   citalopram 20 MG tablet Commonly known as:  CELEXA Take 20 mg by mouth daily.   divalproex 125 MG capsule Commonly known as:  DEPAKOTE SPRINKLE Take 125 mg by mouth 3 (three) times daily.   donepezil 10 MG tablet Commonly known as:  ARICEPT Take 10 mg by mouth at bedtime.   esomeprazole 40 MG capsule Commonly known as:  NEXIUM Take 40 mg by mouth at bedtime.   memantine 10 MG tablet Commonly known as:  NAMENDA Take 10 mg by mouth 2 (two) times daily.   metoprolol succinate 50 MG 24 hr tablet Commonly known as:  TOPROL-XL Take 50 mg by mouth daily. Take with or immediately following a meal.   nitroGLYCERIN 0.4 MG SL tablet Commonly known as:  NITROSTAT Place 0.4 mg under the tongue every 5 (five) minutes as needed for chest pain.   pravastatin 40 MG tablet Commonly known as:  PRAVACHOL Take 40 mg by mouth daily.   risperiDONE 1 MG tablet Commonly known as:  RISPERDAL Take 1 mg by mouth 2 (two) times daily.   traZODone 100 MG tablet Commonly known as:  DESYREL Take 100 mg by mouth at bedtime as needed for sleep.   vitamin B-12 1000 MCG tablet Commonly known as:  CYANOCOBALAMIN Take 1,000 mcg by mouth daily.        DISCHARGE INSTRUCTIONS:   1. PCP  f/u in 1-2 weeks  DIET:   Low sodium diet  ACTIVITY:   As tolerated  OXYGEN:   Home Oxygen: No Oxygen Delivery: Room air  DISCHARGE LOCATION:   [place]  If you experience worsening of your admission symptoms, develop shortness of breath, life threatening emergency, suicidal or homicidal thoughts you must seek medical attention immediately by calling 911 or calling your MD immediately  if symptoms less severe.  You Must read complete instructions/literature along with all the possible adverse reactions/side  effects for all the Medicines you take and that have been prescribed to you. Take any new Medicines after you have completely understood and accpet all the possible adverse reactions/side effects.   Please note  You were cared for by a hospitalist during your hospital stay. If you have any questions about your discharge medications or the care you received while you were in the hospital after you are discharged, you can call the unit and asked to speak with the hospitalist on call if the hospitalist that took care of you is not available. Once you are discharged, your primary care physician will handle any further medical issues. Please note that NO REFILLS for any discharge medications will be authorized once you are discharged, as it is imperative that you return to your primary care physician (or establish a relationship with a primary care physician if you do not have one) for your aftercare needs so that they can reassess your need for medications and monitor your lab values.    On the day of Discharge:  VITAL SIGNS:   Blood pressure (!) 141/73, pulse (!) 102, temperature 98 F (36.7 C), temperature source Oral, resp. rate 18, height 5\' 4"  (1.626 m), weight 54.4 kg (120 lb), SpO2 99 %.  PHYSICAL EXAMINATION:    GENERAL:  83 y.o.-year-old patient lying in the bed with no acute distress.  EYES: Pupils equal, round, reactive to light and accommodation. No scleral icterus. Extraocular muscles intact.  HEENT: Head atraumatic, normocephalic. Oropharynx and nasopharynx clear.  NECK:  Supple, no jugular venous distention. No thyroid enlargement, no tenderness.  LUNGS: Normal breath sounds bilaterally, no wheezing, rales,rhonchi or crepitation. No use of accessory muscles of respiration. Decreased bibasilar breath sounds CARDIOVASCULAR: S1, S2 normal. No rubs, or gallops. 2/6 systolic murmur present ABDOMEN: Soft, nontender, nondistended. Bowel sounds present. No organomegaly or mass.    EXTREMITIES: No pedal edema, cyanosis, or clubbing.  NEUROLOGIC: follows simple commands, sensation intact, moving all extremities, some weakness of lower extremities. Gait not checked.  PSYCHIATRIC: The patient is alert but not oriented SKIN: No obvious rash, lesion, or ulcer.    DATA REVIEW:   CBC  Recent Labs Lab 08/02/16 0445  WBC 3.9  HGB 10.8*  HCT 31.0*  PLT 211    Chemistries   Recent Labs Lab 08/02/16 0049 08/02/16 0445  NA 139 140  K 3.1* 3.5  CL 103 104  CO2 29 28  GLUCOSE 89 79  BUN 15 13  CREATININE 1.06 1.09  CALCIUM 8.3* 8.5*  AST 35  --   ALT 20  --   ALKPHOS 41  --   BILITOT 0.7  --      Microbiology Results  Results for orders placed or performed during the hospital encounter of 08/01/16  MRSA PCR Screening     Status: Abnormal   Collection Time: 08/02/16  4:10 AM  Result Value Ref Range Status   MRSA by PCR POSITIVE (A) NEGATIVE Final  Comment:        The GeneXpert MRSA Assay (FDA approved for NASAL specimens only), is one component of a comprehensive MRSA colonization surveillance program. It is not intended to diagnose MRSA infection nor to guide or monitor treatment for MRSA infections. RESULT CALLED TO, READ BACK BY AND VERIFIED WITH: Sabra Heck @ [id number] 08/02/16 by [place]:  Dg Pelvis 1-2 Views  Result Date: 08/02/2016 CLINICAL DATA:  Unwitnessed fall, noncommunicative. EXAM: PELVIS - 1-2 VIEW COMPARISON:  None. FINDINGS: There is no evidence of pelvic fracture or diastasis. No pelvic bone lesions are seen. Moderate vascular calcifications. IMPRESSION: Negative. Electronically Signed   By: Elon Alas M.D.   On: 08/02/2016 00:58   Ct Head Wo Contrast  Result Date: 08/02/2016 CLINICAL DATA:  Status post unwitnessed fall. Diffuse head neck pain. Initial encounter. EXAM: CT HEAD WITHOUT CONTRAST CT CERVICAL SPINE WITHOUT CONTRAST TECHNIQUE: Multidetector CT imaging of the head and cervical spine was performed  following the standard protocol without intravenous contrast. Multiplanar CT image reconstructions of the cervical spine were also generated. COMPARISON:  None. FINDINGS: CT HEAD FINDINGS Brain: No evidence of acute infarction, hemorrhage, hydrocephalus, extra-axial collection or mass lesion/mass effect. Prominence of ventricles and sulci reflects mild to moderate cortical volume loss. Scattered periventricular and subcortical white matter change likely reflects small vessel ischemic microangiopathy. Cerebellar atrophy is noted. The brainstem and fourth ventricle are within normal limits. The basal ganglia are unremarkable in appearance. The cerebral hemispheres demonstrate grossly normal gray-white differentiation. No mass effect or midline shift is seen. Vascular: No hyperdense vessel or unexpected calcification. Skull: There is no evidence of fracture; visualized osseous structures are unremarkable in appearance. Sinuses/Orbits: The visualized portions of the orbits are within normal limits. The paranasal sinuses and mastoid air cells are well-aerated. Other: No significant soft tissue abnormalities are seen. CT CERVICAL SPINE FINDINGS Alignment: Normal. Skull base and vertebrae: No acute fracture. No primary bone lesion or focal pathologic process. Soft tissues and spinal canal: No prevertebral fluid or swelling. No visible canal hematoma. Disc levels: Multilevel disc space narrowing is noted along the cervical spine, with scattered anterior and posterior disc osteophyte complexes. Degenerative change is noted about the dens. Upper chest: The visualized portions of the thyroid gland are unremarkable. The visualized lung apices are grossly clear. Calcification is seen at the carotid bifurcations bilaterally. Other: No additional soft tissue abnormalities are seen. IMPRESSION: 1. No evidence of traumatic intracranial injury or fracture. 2. No evidence of fracture or subluxation along the cervical spine. 3. Mild to  moderate cortical volume loss and scattered small vessel ischemic microangiopathy. 4. Mild diffuse degenerative change along the cervical spine. 5. Calcification at the carotid bifurcations bilaterally. Carotid ultrasound would be helpful for further evaluation, when and as deemed clinically appropriate. Electronically Signed   By: Garald Balding M.D.   On: 08/02/2016 00:43   Ct Cervical Spine Wo Contrast  Result Date: 08/02/2016 CLINICAL DATA:  Status post unwitnessed fall. Diffuse head neck pain. Initial encounter. EXAM: CT HEAD WITHOUT CONTRAST CT CERVICAL SPINE WITHOUT CONTRAST TECHNIQUE: Multidetector CT imaging of the head and cervical spine was performed following the standard protocol without intravenous contrast. Multiplanar CT image reconstructions of the cervical spine were also generated. COMPARISON:  None. FINDINGS: CT HEAD FINDINGS Brain: No evidence of acute infarction, hemorrhage, hydrocephalus, extra-axial collection or mass lesion/mass effect. Prominence of ventricles and sulci reflects mild to moderate cortical volume loss. Scattered periventricular and subcortical white matter change likely reflects  small vessel ischemic microangiopathy. Cerebellar atrophy is noted. The brainstem and fourth ventricle are within normal limits. The basal ganglia are unremarkable in appearance. The cerebral hemispheres demonstrate grossly normal gray-white differentiation. No mass effect or midline shift is seen. Vascular: No hyperdense vessel or unexpected calcification. Skull: There is no evidence of fracture; visualized osseous structures are unremarkable in appearance. Sinuses/Orbits: The visualized portions of the orbits are within normal limits. The paranasal sinuses and mastoid air cells are well-aerated. Other: No significant soft tissue abnormalities are seen. CT CERVICAL SPINE FINDINGS Alignment: Normal. Skull base and vertebrae: No acute fracture. No primary bone lesion or focal pathologic process.  Soft tissues and spinal canal: No prevertebral fluid or swelling. No visible canal hematoma. Disc levels: Multilevel disc space narrowing is noted along the cervical spine, with scattered anterior and posterior disc osteophyte complexes. Degenerative change is noted about the dens. Upper chest: The visualized portions of the thyroid gland are unremarkable. The visualized lung apices are grossly clear. Calcification is seen at the carotid bifurcations bilaterally. Other: No additional soft tissue abnormalities are seen. IMPRESSION: 1. No evidence of traumatic intracranial injury or fracture. 2. No evidence of fracture or subluxation along the cervical spine. 3. Mild to moderate cortical volume loss and scattered small vessel ischemic microangiopathy. 4. Mild diffuse degenerative change along the cervical spine. 5. Calcification at the carotid bifurcations bilaterally. Carotid ultrasound would be helpful for further evaluation, when and as deemed clinically appropriate. Electronically Signed   By: Garald Balding M.D.   On: 08/02/2016 00:43   US Carotid Bilateral  Result Date: 08/02/2016 CLINICAL DATA:  Intermittent syncope x2 weeks EXAM: BILATERAL CAROTID DUPLEX ULTRASOUND TECHNIQUE: Pearline Cables scale imaging, color Doppler and duplex ultrasound was performed of bilateral carotid and vertebral arteries in the neck. COMPARISON:  None. TECHNIQUE: Quantification of carotid stenosis is based on velocity parameters that correlate the residual internal carotid diameter with NASCET-based stenosis levels, using the diameter of the distal internal carotid lumen as the denominator for stenosis measurement. The following velocity measurements were obtained: PEAK SYSTOLIC/END DIASTOLIC RIGHT ICA:                     88/19cm/sec CCA:                     [id number] SYSTOLIC ICA/CCA RATIO:  1.4 DIASTOLIC ICA/CCA RATIO: 1.9 ECA:                     67cm/sec LEFT ICA:                     104/21cm/sec CCA:                     [id number]  SYSTOLIC ICA/CCA RATIO:  1.3 DIASTOLIC ICA/CCA RATIO: 1.7 ECA:                     97cm/sec FINDINGS: RIGHT CAROTID ARTERY: Eccentric partially calcified plaque in the distal common carotid artery and bulb without high-grade stenosis. Normal waveforms and color Doppler signal. ICA tortuous. RIGHT VERTEBRAL ARTERY:  Normal flow direction and waveform. LEFT CAROTID ARTERY: Eccentric partially calcified plaque in the bulb and proximal ICA. No high-grade stenosis. Normal waveforms and color Doppler signal. LEFT VERTEBRAL ARTERY: Normal flow direction and waveform. IMPRESSION: 1. Bilateral carotid bifurcation and proximal ICA plaque resulting in less than 50% diameter stenosis. 2. Antegrade bilateral vertebral arterial flow. Electronically Signed   By: Keturah Barre  Vernard Gambles M.D.   On: 08/02/2016 10:56   Dg Chest Port 1 View  Result Date: 08/02/2016 CLINICAL DATA:  Unwitnessed fall, noncommunicative.  History CHF. EXAM: PORTABLE CHEST 1 VIEW COMPARISON:  None. FINDINGS: Cardiac silhouette is mildly enlarged. Status post median sternotomy for CABG. Heavily calcified aortic knob. No pleural effusion or focal consolidation. Minimal bibasilar atelectasis. No pneumothorax. Soft tissue planes and included osseous structures are nonsuspicious. IMPRESSION: Mild cardiomegaly, s/p CABG.  Minimal bibasilar atelectasis. Electronically Signed   By: Elon Alas M.D.   On: 08/02/2016 00:57     Management plans discussed with the patient, family and they are in agreement.  CODE STATUS:     Code Status Orders        Start     Ordered   08/02/16 0359  Full code  Continuous     08/02/16 0358    Code Status History    Date Active Date Inactive Code Status Order ID Comments User Context   This patient has a current code status but no historical code status.      TOTAL TIME TAKING CARE OF THIS PATIENT: 38 minutes.    Gladstone Lighter M.D on 08/02/2016 at 4:39 PM  Between 7am to 6pm - Pager - [phone]  After 6pm  go to [email or web address] - Proofreader  [facility]  Office  [phone]  CC: Primary care physician; [facility]   Note: This dictation was prepared with Dragon dictation along with smaller phrase technology. Any transcriptional errors that result from this process are unintentional.

## 2016-08-02 NOTE — Progress Notes (Signed)
Pt discharged to [facility] via EMS. No C/o pian, no SOB. VS stable. No concerns offered.

## 2016-08-02 NOTE — Clinical Social Work Note (Signed)
Clinical Social Work Assessment  Patient Details  Name: Johnathan Whitney MRN: [id number] Date of Birth: 17-Apr-1932  Date of referral:  08/02/16               Reason for consult:  Facility Placement                Permission sought to share information with:  Facility Sport and exercise psychologist, Family Supports Permission granted to share information::  Yes, Verbal Permission Granted  Name::     Currie Paris  [phone]   Agency::  [facility]  Relationship::     Contact Information:     Housing/Transportation Living arrangements for the past 2 months:  [place] of Information:  Adult Children, Spouse Patient Interpreter Needed:  None Criminal Activity/Legal Involvement Pertinent to Current Situation/Hospitalization:  No - Comment as needed Significant Relationships:  Adult Children, Spouse Lives with:  Facility Resident Do you feel safe going back to the place where you live?  Yes Need for family participation in patient care:  Yes (Comment)  Care giving concerns: Patient's family would like patient to return back to Memory Care Unit ALF.   Social Worker assessment / plan:  Patient is a 83 year old married male who has been residing at [facility] in the memory care unit, he is alert and oriented x1.  Patient has been living in ALF since the beginning of February in the memory care unit.  Patient's family did not express any concerns or issues about him returning back to Memory Care ALF.  Patient and family were explained role of CSW and what the process is for facilitating him to return back to memory Care ALF.  Patient and his family did not have any other questions or concerns.  Employment status:  Retired Nurse, adult, Medicaid In [place] PT Recommendations:  Home with [place] / Referral to community resources:     Patient/Family's Response to care:  Patient's family did not express  any concerns about returning back to ALF.  Patient/Family's Understanding of and Emotional Response to Diagnosis, Current Treatment, and Prognosis:  Patient's family are glad he is returning to ALF, and are relieved tests did not show anything that would be concerning.  Emotional Assessment Appearance:    Attitude/Demeanor/Rapport:    Affect (typically observed):  Appropriate, Calm, Stable Orientation:  Oriented to Self Alcohol / Substance use:  Not Applicable Psych involvement (Current and /or in the community):  No (Comment)  Discharge Needs  Concerns to be addressed:  No discharge needs identified Readmission within the last 30 days:  No Current discharge risk:  Cognitively Impaired Barriers to Discharge:  No Barriers Identified   Ross Ludwig, LCSWA 08/02/2016, 5:22 PM

## 2016-08-02 NOTE — ED Notes (Signed)
report off to butch rn

## 2016-08-02 NOTE — Evaluation (Signed)
Physical Therapy Evaluation Patient Details Name: Johnathan Whitney MRN: [id number] DOB: 18-Feb-1932 Today's Date: 08/02/2016   History of Present Illness  Pt is a 83 yo male, admitted to [facility] w/ syncopal episode, he is a resident of [place] and has dementia. PMH includes; Dementia, GERD, gout, HLD, and CHF     Clinical Impression  Pt awake w/ flat affect and disoriented to time, place and situation. He is able to accurately provide his name and DOB and will follows basic commands appropriately. Unable to determine how well the patient functioned at baseline, he stated he does have and uses a RW or cane to ambulate and has difficulty walking due to bilat knee pain. Performed a set of orthostatics and blood pressure was 125/51 supine, 116/53 sitting (34mins), 140/47 sitting (3 mins), 105/60 standing (40mins) and 116/53 sitting after ambulating. Complained of some dizziness changing positions that improved within a couple mins. Pt presented w/ decreased overall strength throughout grossly at least 3/5. He requires increased time to initiate movements and demonstrated decreased mobility; he requires mod assist to move to sitting and min assist for transfers and ambulation w/ a RW. Pt is a high fall risk w/ safety awareness concerns; he required frequent cuing and min assist to use a RW and appeared unstable when ambulating especially during turns. Pt displays decreased safety awareness, strength and balance that limit safe functional mobility, he will benefit from skilled PT to correct deficits. Recommend pt transition to STR following acute hospital stay.      Follow Up Recommendations SNF    Equipment Recommendations  Rolling walker with 5" wheels    Recommendations for Other Services       Precautions / Restrictions Precautions Precautions: Fall Restrictions Weight Bearing Restrictions: No      Mobility  Bed Mobility Overal bed mobility: Needs Assistance Bed Mobility: Supine to Sit      Supine to sit: Mod assist     General bed mobility comments: very slow initiating movement and requires mod assistance to advance B LE's and bring trunk to upright position  Transfers Overall transfer level: Needs assistance Equipment used: Rolling walker (2 wheeled) Transfers: Sit to/from Stand Sit to Stand: Min assist         General transfer comment: slow ascension to standing w/ RW, decreased safety w/ RW  Ambulation/Gait Ambulation/Gait assistance: Min assist Ambulation Distance (Feet): 20 Feet Assistive device: Rolling walker (2 wheeled) Gait Pattern/deviations: Step-to pattern;Decreased step length - right;Decreased step length - left;Decreased stride length;Shuffle   Gait velocity interpretation: <1.8 ft/sec, indicative of risk for recurrent falls General Gait Details: min assist throughout gait to room door and back, takes very small shuffling steps, improved to slightly longer steps w/ verbal cues, decreased safety awarness w/ RW especially during turning required verbal and tactile cuing and min assist to prevent LOB and safely ambulate   Stairs            Wheelchair Mobility    Modified Rankin (Stroke Patients Only)       Balance Overall balance assessment: Needs assistance;History of Falls Sitting-balance support: Single extremity supported;Feet supported Sitting balance-Leahy Scale: Fair Sitting balance - Comments: head and neck are forwadly flexed during sitting, stated he felt dizzy,  Postural control: Posterior lean;Right lateral lean Standing balance support: Bilateral upper extremity supported Standing balance-Leahy Scale: Poor Standing balance comment: requires use of RW and min assist to maintain standing posture,  Pertinent Vitals/Pain Pain Assessment: No/denies pain    Home Living Family/patient expects to be discharged to:: Assisted living               Home Equipment: Walker - 2  wheels;Cane - single point Additional Comments: Lives at [facility], difficult to obtain accurate history from the patient     Prior Function           Comments: Unsure what PLF was since patient is poor historian      Hand Dominance   Dominant Hand: Right    Extremity/Trunk Assessment   Upper Extremity Assessment Upper Extremity Assessment: Generalized weakness (grossly at least 3/5)    Lower Extremity Assessment Lower Extremity Assessment: Generalized weakness (grossly at least 3/5)       Communication   Communication: Other (comment) (delayed responses, takes several seconds to respond, )  Cognition Arousal/Alertness: Awake/alert Behavior During Therapy: Flat affect Overall Cognitive Status: No family/caregiver present to determine baseline cognitive functioning Area of Impairment: Orientation Orientation Level: Disoriented to;Place;Time;Situation             General Comments: Able to state name and DOB accurately, does not know where he is or the day, month or year, able to recall history and stated he is from the [place] and moved here in his 92's, says he has to focus a lot to tell us how many children and grandchildren he has    General Comments      Exercises     Assessment/Plan    PT Assessment Patient needs continued PT services  PT Problem List Decreased strength;Decreased range of motion;Decreased activity tolerance;Decreased balance;Decreased mobility;Decreased knowledge of use of DME;Decreased cognition;Decreased safety awareness       PT Treatment Interventions DME instruction;Gait training;Functional mobility training;Balance training;Therapeutic exercise;Therapeutic activities;Patient/family education    PT Goals (Current goals can be found in the Care Plan section)  Acute Rehab PT Goals Patient Stated Goal: Return home PT Goal Formulation: With patient Time For Goal Achievement: 08/16/16 Potential to Achieve Goals: Fair     Frequency Min 2X/week   Barriers to discharge        Co-evaluation               End of Session Equipment Utilized During Treatment: Gait belt Activity Tolerance: Patient tolerated treatment well Patient left: in chair;with chair alarm set;with call bell/phone within reach   PT Visit Diagnosis: Unsteadiness on feet (R26.81);Muscle weakness (generalized) (M62.81);History of falling (Z91.81);Difficulty in walking, not elsewhere classified (R26.2)         Time: [id number] PT Time Calculation (min) (ACUTE ONLY): 32 min   Charges:         PT G Codes:         [employer] Student PT 08/02/2016, 12:38 PM

## 2016-08-02 NOTE — ED Notes (Signed)
Pt sleeping   siderails up x 2. 

## 2016-08-02 NOTE — ED Notes (Signed)
Patient transported to CT 

## 2016-08-02 NOTE — Care Management (Signed)
Notified pre service dept and UR that it has been reported patient has medicare medicaid. If patient requires home health, [facility] provides home health services to the facilities.

## 2016-08-02 NOTE — Progress Notes (Signed)
EMS called for patient transport to [facility].

## 2016-08-02 NOTE — Care Management Obs Status (Signed)
[place] NOTIFICATION   Patient Details  Name: BOJAN BOMKAMP MRN: [id number] Date of Birth: 1931-08-09   Medicare Observation Status Notification Given:  Yes There is an outside chance that this patient has medicare.  CM Left spanish notice in the room for family.  Patient is spanish speaking but has dementia.  Provided CM phone number for questions   Katrina Stack, RN 08/02/2016, 4:43 PM

## 2016-08-02 NOTE — Clinical Social Work Note (Signed)
Patient to be d/c'ed today to [facility] unit. Patient and family agreeable to plans will transport via ems no need to call report patient is returning to ALF.  Evette Cristal, MSW, [place]

## 2016-08-05 ENCOUNTER — Emergency Department: Payer: Federal, State, Local not specified - PPO

## 2016-08-05 ENCOUNTER — Encounter: Payer: Self-pay | Admitting: *Deleted

## 2016-08-05 ENCOUNTER — Emergency Department
Admission: EM | Admit: 2016-08-05 | Discharge: 2016-08-05 | Disposition: A | Discharge status: Home / Self Care | Payer: Federal, State, Local not specified - PPO | Attending: Emergency Medicine | Admitting: Emergency Medicine

## 2016-08-05 DIAGNOSIS — Y999 Unspecified external cause status: Secondary | ICD-10-CM | POA: Diagnosis not present

## 2016-08-05 DIAGNOSIS — Y929 Unspecified place or not applicable: Secondary | ICD-10-CM | POA: Insufficient documentation

## 2016-08-05 DIAGNOSIS — Z79899 Other long term (current) drug therapy: Secondary | ICD-10-CM | POA: Diagnosis not present

## 2016-08-05 DIAGNOSIS — W19XXXA Unspecified fall, initial encounter: Secondary | ICD-10-CM | POA: Insufficient documentation

## 2016-08-05 DIAGNOSIS — Y939 Activity, unspecified: Secondary | ICD-10-CM | POA: Insufficient documentation

## 2016-08-05 DIAGNOSIS — Z7982 Long term (current) use of aspirin: Secondary | ICD-10-CM | POA: Diagnosis not present

## 2016-08-05 DIAGNOSIS — G309 Alzheimer's disease, unspecified: Secondary | ICD-10-CM | POA: Diagnosis not present

## 2016-08-05 DIAGNOSIS — S7012XA Contusion of left thigh, initial encounter: Secondary | ICD-10-CM | POA: Diagnosis not present

## 2016-08-05 DIAGNOSIS — I509 Heart failure, unspecified: Secondary | ICD-10-CM | POA: Insufficient documentation

## 2016-08-05 DIAGNOSIS — S40012A Contusion of left shoulder, initial encounter: Secondary | ICD-10-CM | POA: Diagnosis not present

## 2016-08-05 DIAGNOSIS — S4992XA Unspecified injury of left shoulder and upper arm, initial encounter: Secondary | ICD-10-CM | POA: Diagnosis present

## 2016-08-05 HISTORY — DX: Repeated falls: R29.6

## 2016-08-05 LAB — BASIC METABOLIC PANEL
ANION GAP: 7 (ref 5–15)
BUN: 18 mg/dL (ref 6–20)
CO2: 28 mmol/L (ref 22–32)
Calcium: 8.4 mg/dL — ABNORMAL LOW (ref 8.9–10.3)
Chloride: 104 mmol/L (ref 101–111)
Creatinine, Ser: 1.2 mg/dL (ref 0.61–1.24)
GFR calc Af Amer: >60 mL/min (ref >60)
GFR, EST NON AFRICAN AMERICAN: 54 mL/min — AB (ref >60)
GLUCOSE: 93 mg/dL (ref 65–99)
POTASSIUM: 3.6 mmol/L (ref 3.5–5.1)
SODIUM: 139 mmol/L (ref 135–145)

## 2016-08-05 LAB — CBC
HCT: 34.4 % — ABNORMAL LOW (ref 40.0–52.0)
HEMOGLOBIN: 11.9 g/dL — AB (ref 13.0–18.0)
MCH: 32.2 pg (ref 26.0–34.0)
MCHC: 34.5 g/dL (ref 32.0–36.0)
MCV: 93.3 fL (ref 80.0–100.0)
PLATELETS: 208 10*3/uL (ref 150–440)
RBC: 3.69 MIL/uL — AB (ref 4.40–5.90)
RDW: 14.8 % — ABNORMAL HIGH (ref 11.5–14.5)
WBC: 4.2 10*3/uL (ref 3.8–10.6)

## 2016-08-05 NOTE — Discharge Instructions (Signed)
The patient has been seen in the Emergency Department (ED) today for a fall.  Work up does not show any concerning injuries at this time.    Please encourage use of his walker and fall precautions at [facility]  Please follow up with the patient's doctor regarding today's Emergency Department (ED) visit and recent falls. He may benefit from physical therapy if not already being done.    Return to the ED if patient is to have any headache, confusion, slurred speech, weakness/numbness of any arm or leg, or any increased pain.

## 2016-08-05 NOTE — ED Provider Notes (Signed)
[facility] Emergency Department Provider Note   ____________________________________________   First MD Initiated Contact with Patient 08/05/16 1714     (approximate)  I have reviewed the triage vital signs and the nursing notes.   HISTORY  Chief Complaint Fall EM caveat: Patient with severe dementia  HPI Johnathan Whitney is a 83 y.o. male here for evaluation after a fall  Full multiple times recently. Evidently had another unwitnessed fall today. Is complaining of left shoulder pain. Has known Alzheimer's as well. Family reports that he was to use a walker, but due to his dementia he often forgets, will not use it, and falls frequently.  When asked he denies being in any pain. He reports he feels all right, but when asked what happened he reports he was walking on the street and must have fallen.   Past Medical History:  Diagnosis Date  . CHF (congestive heart failure) ([employer])   . Dementia   . Elevated AST (SGOT)   . Elevated serum creatinine   . Falls frequently   . GERD (gastroesophageal reflux disease)   . Gout   . Hyperlipidemia   . Hyperlipidemia     Patient Active Problem List   Diagnosis Date Noted  . Syncope and collapse 08/02/2016  . Syncope 08/02/2016    Past Surgical History:  Procedure Laterality Date  . none      Prior to Admission medications   Medication Sig Start Date End Date Taking? Authorizing Provider  aspirin 325 MG tablet Take 325 mg by mouth daily.   Yes Historical Provider, MD  cholecalciferol (VITAMIN D) 1000 units tablet Take 1,000 Units by mouth daily.   Yes Historical Provider, MD  citalopram (CELEXA) 20 MG tablet Take 20 mg by mouth daily.   Yes Historical Provider, MD  divalproex (DEPAKOTE SPRINKLE) 125 MG capsule Take 125 mg by mouth 3 (three) times daily.   Yes Historical Provider, MD  donepezil (ARICEPT) 10 MG tablet Take 10 mg by mouth at bedtime.   Yes Historical Provider, MD  esomeprazole (NEXIUM) 40  MG capsule Take 40 mg by mouth at bedtime.   Yes Historical Provider, MD  memantine (NAMENDA) 10 MG tablet Take 10 mg by mouth 2 (two) times daily.   Yes Historical Provider, MD  metoprolol succinate (TOPROL-XL) 50 MG 24 hr tablet Take 50 mg by mouth daily. Take with or immediately following a meal.   Yes Historical Provider, MD  nitroGLYCERIN (NITROSTAT) 0.4 MG SL tablet Place 0.4 mg under the tongue every 5 (five) minutes as needed for chest pain.   Yes Historical Provider, MD  pravastatin (PRAVACHOL) 40 MG tablet Take 40 mg by mouth daily.   Yes Historical Provider, MD  risperiDONE (RISPERDAL) 1 MG tablet Take 1 mg by mouth 2 (two) times daily.   Yes Historical Provider, MD  traZODone (DESYREL) 100 MG tablet Take 100 mg by mouth at bedtime as needed for sleep.   Yes Historical Provider, MD  vitamin B-12 (CYANOCOBALAMIN) 1000 MCG tablet Take 1,000 mcg by mouth daily.   Yes Historical Provider, MD    Allergies Patient has no known allergies.  Family History  Problem Relation Age of Onset  . Diabetes Neg Hx   . Hypertension Neg Hx     Social History Social History  Substance Use Topics  . Smoking status: Never Smoker  . Smokeless tobacco: Never Used  . Alcohol use No    Review of Systems Cardiovascular: Denies chest pain. Respiratory: Denies  shortness of breath. Gastrointestinal: No abdominal pain.   Musculoskeletal: Negative for back pain.Denies neck pain. Skin: Negative for rash. Neurological: Negative for headaches  EM caveat ____________________________________________   PHYSICAL EXAM:  VITAL SIGNS: ED Triage Vitals  Enc Vitals Group     BP 08/05/16 1648 (!) 107/54     Pulse Rate 08/05/16 1648 62     Resp 08/05/16 1648 18     Temp 08/05/16 1648 98.8 F (37.1 C)     Temp Source 08/05/16 1648 Oral     SpO2 08/05/16 1648 96 %     Weight 08/05/16 1650 120 lb (54.4 kg)     Height --      Head Circumference --      Peak Flow --      Pain Score --      Pain Loc --       Pain Edu? --      Excl. in [place]? --     Constitutional: Alert and oriented to self, but not date or place. In no distress. Resting comfortably in bed. Not picking or agitated. Eyes: Conjunctivae are normal. PERRL. EOMI. Head: Atraumatic. Nose: No congestion/rhinnorhea. Mouth/Throat: Mucous membranes are moist.  Oropharynx non-erythematous. Neck: No stridor. No cervical thoracic or lumbar tenderness or step-offs. Cardiovascular: Normal rate, regular rhythm. Grossly normal heart sounds.  Good peripheral circulation. Respiratory: Normal respiratory effort.  No retractions. Lungs CTAB. Gastrointestinal: Soft and nontender. Musculoskeletal: No lower extremity tenderness nor edema.  No joint effusions. Follows commands, lifts arms and moves all extremities well. He has minimal weakness in both lower extremities, but no focal weakness. He has a slight bruise noted over the left upper thigh. No bruising or evidence of evidence of injury about the left shoulder at this time is noted. Normal capillary refill extremities. Neurologic: Moves all extremities. Equal grip strength and smile. No obvious focal deficits.  Skin:  Skin is warm, dry and intact. No rash noted. Psychiatric: Mood and affect are somewhat flat.  ____________________________________________   LABS (all labs ordered are listed, but only abnormal results are displayed)  Labs Reviewed  CBC - Abnormal; Notable for the following:       Result Value   RBC 3.69 (*)    Hemoglobin 11.9 (*)    HCT 34.4 (*)    RDW 14.8 (*)    All other components within normal limits  BASIC METABOLIC PANEL - Abnormal; Notable for the following:    Calcium 8.4 (*)    GFR calc non Af Amer 54 (*)    All other components within normal limits   ____________________________________________  EKG  Reviewed and interpreted by me at 1830 Heart rate 60 QRS 80 QTc 4:30 Normal sinus rhythm, some baseline wander, but no evidence of ischemia. Ischemic  T-wave flattening. ____________________________________________  RADIOLOGY  Dg Shoulder Left Portable  Result Date: 08/05/2016 CLINICAL DATA:  83 y/o  M; fall with left shoulder pain. EXAM: LEFT SHOULDER - 1 VIEW COMPARISON:  None. FINDINGS: There is no evidence of fracture or dislocation. There is no evidence of arthropathy or other focal bone abnormality. Soft tissues are unremarkable. IMPRESSION: Negative. Electronically Signed   By: Kristine Garbe M.D.   On: 08/05/2016 18:17   Dg Hip Unilat W Or Wo Pelvis 2-3 Views Left  Result Date: 08/05/2016 CLINICAL DATA:  Unwitnessed fall.  Contusion over the left hip. EXAM: DG HIP (WITH OR WITHOUT PELVIS) 2-3V LEFT COMPARISON:  08/02/2016 FINDINGS: Pelvic bony ring is intact. Normal appearance of  the SI joints. Left hip is located without a fracture. Minimal joint space narrowing along the superior left hip joint. No significant osteoarthritic changes in left hip. IMPRESSION: No acute abnormality. Electronically Signed   By: Markus Daft M.D.   On: 08/05/2016 20:19    ____________________________________________   PROCEDURES  Procedure(s) performed: None  Procedures  Critical Care performed: No  ____________________________________________   INITIAL IMPRESSION / ASSESSMENT AND PLAN / ED COURSE  Pertinent labs & imaging results that were available during my care of the patient were reviewed by me and considered in my medical decision making (see chart for details).  Patient presents for evaluation after noting shoulder pain. He has fallen multiple times recently, this time without report of any syncope. He is alert, but obviously somewhat confused but does not appear agitated or delirious. Appears most consistent with severe dementia. There is no obvious evidence of traumatic injury on exam today. No evidence of head injury, no report of a head injury, and he did report shoulder pain but at this time denies any pain or discomfort. He  is however confused. Afebrile, not complaining of chest pain will breathing or other concerns. Lungs are clear.  Clinical Course as of Aug 05 2352  Mon Aug 05, 2016  1758 Patient's healthcare power of attorney return call. Discussing goals of care, she reports the patient does have severe dementia. They would not wish for him to have surgery in his current state of health, I do not wish for him to have an extensive evaluation other than to evaluate the left shoulder which they report he complained of pain at the nursing home. He falls one to 2 times daily, and family realizes the extent of his severe dementia. They wish for examination based on any concerns found in the ER, and at the present time the only focal complaint I have is that of left shoulder discomfort which was reported, the patient denies pain now, and is able to range the shoulder well. I will do basic labs, x-ray of the left shoulder, and if this reassuring plan to discharge the patient back to his care facility for further care consistent with the wishes of his power of attorney.  [MQ]    Clinical Course User Index [MQ] Delman Kitten, MD   ----------------------------------------- 8:09 PM on 08/05/2016 -----------------------------------------  The patient is resting, in no distress. A bruise is noted over his left hip, he rates the leg quite well in the bruise appears somewhat old in nature but we will obtain imaging to assure no injury. If x-rays negative, patient appears stable to return to [place] for ongoing care. No evidence of acute shoulder injury. Basic labs stable. Hemodynamics stable  Patient will be transferred back to his facility for ongoing care. Now, patient was recently admitted and have a extensive workup, at this point labs reassuring, appears at his baseline. ____________________________________________   FINAL CLINICAL IMPRESSION(S) / ED DIAGNOSES  Final diagnoses:  Fall, initial encounter  Contusion  of left shoulder, initial encounter  Contusion of left thigh, initial encounter      NEW MEDICATIONS STARTED DURING THIS VISIT:  Discharge Medication List as of 08/05/2016  9:18 PM       Note:  This document was prepared using Dragon voice recognition software and may include unintentional dictation errors.     Delman Kitten, MD 08/05/16 [phone]

## 2016-08-05 NOTE — ED Notes (Signed)
While changing patient, bruise was noted on left hip. Dr. Jacqualine Code is aware.

## 2016-08-05 NOTE — ED Notes (Signed)
Patient left via [facility]. EMTs did not ask for report from this Probation officer.

## 2016-08-05 NOTE — ED Notes (Signed)
Patient is back from imaging. Feet of stretcher raised for safety. Bed alarm is in place.

## 2016-08-05 NOTE — ED Notes (Signed)
Medical necessity completed. LouAnn, Patient Representative, sitting at bedside for patient's comfort.

## 2016-08-05 NOTE — ED Notes (Signed)
Patient is resting on stretcher. Patient has pulled down his pants, denies needing to go to the bathroom. Bed alarm still in place.

## 2016-08-05 NOTE — ED Triage Notes (Addendum)
Per EMS report, patient had an unwitnessed fall at [facility]. Patient c/o left shoulder pain and left neck pain which is chronic. Patient states he didn't fall today. No information concerning the fall was given to this Probation officer. Patient has Alzheimer and Dementia and is at baseline according to staff at [facility]. Patient usually ambulates with one assist.

## 2016-08-08 ENCOUNTER — Emergency Department
Admission: EM | Admit: 2016-08-08 | Discharge: 2016-08-08 | Disposition: A | Discharge status: Home / Self Care | Payer: Federal, State, Local not specified - PPO | Attending: Emergency Medicine | Admitting: Emergency Medicine

## 2016-08-08 ENCOUNTER — Emergency Department: Payer: Federal, State, Local not specified - PPO

## 2016-08-08 ENCOUNTER — Encounter: Payer: Self-pay | Admitting: Emergency Medicine

## 2016-08-08 DIAGNOSIS — W1839XA Other fall on same level, initial encounter: Secondary | ICD-10-CM | POA: Diagnosis not present

## 2016-08-08 DIAGNOSIS — Y939 Activity, unspecified: Secondary | ICD-10-CM | POA: Insufficient documentation

## 2016-08-08 DIAGNOSIS — R296 Repeated falls: Secondary | ICD-10-CM | POA: Diagnosis not present

## 2016-08-08 DIAGNOSIS — S065X9A Traumatic subdural hemorrhage with loss of consciousness of unspecified duration, initial encounter: Secondary | ICD-10-CM

## 2016-08-08 DIAGNOSIS — G960 Cerebrospinal fluid leak: Secondary | ICD-10-CM

## 2016-08-08 DIAGNOSIS — Y92129 Unspecified place in nursing home as the place of occurrence of the external cause: Secondary | ICD-10-CM | POA: Diagnosis not present

## 2016-08-08 DIAGNOSIS — Y999 Unspecified external cause status: Secondary | ICD-10-CM | POA: Diagnosis not present

## 2016-08-08 DIAGNOSIS — G9608 Other cranial cerebrospinal fluid leak: Secondary | ICD-10-CM

## 2016-08-08 DIAGNOSIS — D181 Lymphangioma, any site: Secondary | ICD-10-CM | POA: Diagnosis not present

## 2016-08-08 DIAGNOSIS — Z79899 Other long term (current) drug therapy: Secondary | ICD-10-CM | POA: Diagnosis not present

## 2016-08-08 DIAGNOSIS — Z7982 Long term (current) use of aspirin: Secondary | ICD-10-CM | POA: Diagnosis not present

## 2016-08-08 DIAGNOSIS — I509 Heart failure, unspecified: Secondary | ICD-10-CM | POA: Diagnosis not present

## 2016-08-08 DIAGNOSIS — S065X0A Traumatic subdural hemorrhage without loss of consciousness, initial encounter: Secondary | ICD-10-CM | POA: Diagnosis not present

## 2016-08-08 DIAGNOSIS — S065XAA Traumatic subdural hemorrhage with loss of consciousness status unknown, initial encounter: Secondary | ICD-10-CM

## 2016-08-08 DIAGNOSIS — W19XXXA Unspecified fall, initial encounter: Secondary | ICD-10-CM

## 2016-08-08 DIAGNOSIS — S0990XA Unspecified injury of head, initial encounter: Secondary | ICD-10-CM | POA: Diagnosis present

## 2016-08-08 LAB — CBC WITH DIFFERENTIAL/PLATELET
BASOS PCT: 1 %
Basophils Absolute: 0 10*3/uL (ref 0–0.1)
EOS ABS: 0 10*3/uL (ref 0–0.7)
Eosinophils Relative: 0 %
HEMATOCRIT: 34.7 % — AB (ref 40.0–52.0)
HEMOGLOBIN: 11.9 g/dL — AB (ref 13.0–18.0)
LYMPHS ABS: 0.6 10*3/uL — AB (ref 1.0–3.6)
Lymphocytes Relative: 20 %
MCH: 32.4 pg (ref 26.0–34.0)
MCHC: 34.3 g/dL (ref 32.0–36.0)
MCV: 94.4 fL (ref 80.0–100.0)
MONO ABS: 0.5 10*3/uL (ref 0.2–1.0)
MONOS PCT: 18 %
Neutro Abs: 1.7 10*3/uL (ref 1.4–6.5)
Neutrophils Relative %: 61 %
Platelets: 159 10*3/uL (ref 150–440)
RBC: 3.67 MIL/uL — ABNORMAL LOW (ref 4.40–5.90)
RDW: 15.4 % — AB (ref 11.5–14.5)
WBC: 2.8 10*3/uL — ABNORMAL LOW (ref 3.8–10.6)

## 2016-08-08 LAB — URINALYSIS, COMPLETE (UACMP) WITH MICROSCOPIC
BILIRUBIN URINE: NEGATIVE
Bacteria, UA: NONE SEEN
GLUCOSE, UA: NEGATIVE mg/dL
Hgb urine dipstick: NEGATIVE
KETONES UR: 5 mg/dL — AB
LEUKOCYTES UA: NEGATIVE
NITRITE: NEGATIVE
PH: 6 (ref 5.0–8.0)
Protein, ur: NEGATIVE mg/dL
SPECIFIC GRAVITY, URINE: 1.019 (ref 1.005–1.030)

## 2016-08-08 LAB — TROPONIN I
TROPONIN I: 0.06 ng/mL — AB (ref <0.03)
TROPONIN I: 0.05 ng/mL — AB (ref <0.03)

## 2016-08-08 LAB — BASIC METABOLIC PANEL
ANION GAP: 9 (ref 5–15)
BUN: 23 mg/dL — ABNORMAL HIGH (ref 6–20)
CHLORIDE: 105 mmol/L (ref 101–111)
CO2: 28 mmol/L (ref 22–32)
Calcium: 8.2 mg/dL — ABNORMAL LOW (ref 8.9–10.3)
Creatinine, Ser: 1.35 mg/dL — ABNORMAL HIGH (ref 0.61–1.24)
GFR, EST AFRICAN AMERICAN: 55 mL/min — AB (ref >60)
GFR, EST NON AFRICAN AMERICAN: 47 mL/min — AB (ref >60)
Glucose, Bld: 84 mg/dL (ref 65–99)
POTASSIUM: 3.6 mmol/L (ref 3.5–5.1)
SODIUM: 142 mmol/L (ref 135–145)

## 2016-08-08 MED ORDER — SODIUM CHLORIDE 0.9 % IV BOLUS (SEPSIS)
500.0000 mL | Freq: Once | INTRAVENOUS | Status: AC
  Administered 2016-08-08: 500 mL via INTRAVENOUS

## 2016-08-08 NOTE — ED Triage Notes (Signed)
Patient comes in from [facility] via [place] with apparent unwitnessed fall. Per EMS not sure how fall was unwitnessed due to aid being with him all night. EMS reports they found him laying by his bed and only complaint is neck pain. Patient has hx of dementia, HTN, kidney injury. Patient has a hx of frequent falls. Patient states he did not fall.

## 2016-08-08 NOTE — ED Notes (Signed)
Pt currently resting, equal rise and fall of chest noted. Pt from [facility], has hx of dementia. Dried food is noted around pt's mouth, pt denies eating breakfast PTA.

## 2016-08-08 NOTE — ED Provider Notes (Signed)
[facility] Emergency Department Provider Note ____________________________________________   I have reviewed the triage vital signs and the triage nursing note.  HISTORY  Chief Complaint Fall   Historian History limited by patient dementia/poor historian History provided by EMS report from nursing home  HPI Johnathan Whitney is a 83 y.o. male with history of dementia, and frequent falls, lives in nursing home, reportedly brought in by EMS after being found beside his bed. Patient reported to have a history of chronic neck pain, when asked to be having any pain he says no.  There is no report of recent illness.  By chart review, patient has been to the ED before because of falls.    Past Medical History:  Diagnosis Date  . CHF (congestive heart failure) ([place])   . Dementia   . Elevated AST (SGOT)   . Elevated serum creatinine   . Falls frequently   . GERD (gastroesophageal reflux disease)   . Gout   . Hyperlipidemia   . Hyperlipidemia     Patient Active Problem List   Diagnosis Date Noted  . Syncope and collapse 08/02/2016  . Syncope 08/02/2016    Past Surgical History:  Procedure Laterality Date  . none      Prior to Admission medications   Medication Sig Start Date End Date Taking? Authorizing Provider  acetaminophen (TYLENOL) 500 MG tablet Take 500 mg by mouth 2 (two) times daily as needed.   Yes Historical Provider, MD  aspirin 325 MG tablet Take 325 mg by mouth daily.   Yes Historical Provider, MD  cholecalciferol (VITAMIN D) 1000 units tablet Take 1,000 Units by mouth daily.   Yes Historical Provider, MD  citalopram (CELEXA) 20 MG tablet Take 20 mg by mouth daily.   Yes Historical Provider, MD  divalproex (DEPAKOTE SPRINKLE) 125 MG capsule Take 125 mg by mouth 3 (three) times daily.   Yes Historical Provider, MD  donepezil (ARICEPT) 10 MG tablet Take 10 mg by mouth at bedtime.   Yes Historical Provider, MD  esomeprazole (NEXIUM) 40  MG capsule Take 40 mg by mouth at bedtime.   Yes Historical Provider, MD  memantine (NAMENDA) 10 MG tablet Take 10 mg by mouth 2 (two) times daily.   Yes Historical Provider, MD  metoprolol succinate (TOPROL-XL) 50 MG 24 hr tablet Take 50 mg by mouth daily. Take with or immediately following a meal.   Yes Historical Provider, MD  pravastatin (PRAVACHOL) 40 MG tablet Take 40 mg by mouth daily.   Yes Historical Provider, MD  risperiDONE (RISPERDAL) 1 MG tablet Take 1 mg by mouth 2 (two) times daily.   Yes Historical Provider, MD  traZODone (DESYREL) 100 MG tablet Take 100 mg by mouth at bedtime as needed for sleep.   Yes Historical Provider, MD  vitamin B-12 (CYANOCOBALAMIN) 1000 MCG tablet Take 1,000 mcg by mouth daily.   Yes Historical Provider, MD  nitroGLYCERIN (NITROSTAT) 0.4 MG SL tablet Place 0.4 mg under the tongue every 5 (five) minutes as needed for chest pain.    Historical Provider, MD    No Known Allergies  Family History  Problem Relation Age of Onset  . Diabetes Neg Hx   . Hypertension Neg Hx     Social History Social History  Substance Use Topics  . Smoking status: Never Smoker  . Smokeless tobacco: Never Used  . Alcohol use No    Review of Systems Unable to obtain due to patient dementia   Patient  denies head, chest, abdomen, extremity pain. ____________________________________________   PHYSICAL EXAM:  VITAL SIGNS: ED Triage Vitals  Enc Vitals Group     BP 08/08/16 0645 (!) 157/69     Pulse Rate 08/08/16 0645 (!) 56     Resp 08/08/16 0645 16     Temp 08/08/16 0645 97.5 F (36.4 C)     Temp Source 08/08/16 0645 Oral     SpO2 08/08/16 0645 97 %     Weight 08/08/16 0646 120 lb (54.4 kg)     Height 08/08/16 0646 5\' 4"  (1.626 m)     Head Circumference --      Peak Flow --      Pain Score --      Pain Loc --      Pain Edu? --      Excl. in [place]? --      Constitutional: Sleeping but then arouses to sternal rub and able to answer essentially yes no  questions. HEENT   Head: Normocephalic and atraumatic.      Eyes: Conjunctivae are normal. PERRL. Normal extraocular movements.      Ears:         Nose: No congestion/rhinnorhea.   Mouth/Throat: Mucous membranes are moist.  Dried fluid around his mouth.   Neck: No stridor.  Mild posterior neck pain on palpation without step off. Cardiovascular/Chest: Normal rate, regular rhythm.  No murmurs, rubs, or gallops. Respiratory: Normal respiratory effort without tachypnea nor retractions. Breath sounds are clear and equal bilaterally. No wheezes/rales/rhonchi. Gastrointestinal: Soft. No distention, no guarding, no rebound. Nontender.   Genitourinary/rectal:Deferred Musculoskeletal: Pelvis stable. Hips nontender to range of motion. Nontender with normal range of motion in all extremities. No joint effusions.  No lower extremity tenderness.  No edema. Neurologic:  A little slow to answer questions, but no facial droop or slurred speech. No gross focal motor neurologic deficits. Moving 4 extremities. Skin:  Skin is warm, dry and intact. No rash noted. Psychiatric: No agitation.   ____________________________________________  LABS (pertinent positives/negatives)  Labs Reviewed  BASIC METABOLIC PANEL - Abnormal; Notable for the following:       Result Value   BUN 23 (*)    Creatinine, Ser 1.35 (*)    Calcium 8.2 (*)    GFR calc non Af Amer 47 (*)    GFR calc Af Amer 55 (*)    All other components within normal limits  TROPONIN I - Abnormal; Notable for the following:    Troponin I 0.06 (*)    All other components within normal limits  URINALYSIS, COMPLETE (UACMP) WITH MICROSCOPIC - Abnormal; Notable for the following:    Color, Urine YELLOW (*)    APPearance CLEAR (*)    Ketones, ur 5 (*)    Squamous Epithelial / LPF 0-5 (*)    All other components within normal limits  CBC WITH DIFFERENTIAL/PLATELET - Abnormal; Notable for the following:    WBC 2.8 (*)    RBC 3.67 (*)     Hemoglobin 11.9 (*)    HCT 34.7 (*)    RDW 15.4 (*)    Lymphs Abs 0.6 (*)    All other components within normal limits  TROPONIN I - Abnormal; Notable for the following:    Troponin I 0.05 (*)    All other components within normal limits    ____________________________________________    EKG I, Lisa Roca, MD, the attending physician have personally viewed and interpreted all ECGs.  None ____________________________________________  RADIOLOGY All  Xrays were viewed by me. Imaging interpreted by Radiologist.  CT head and cervical spine without contrast:  IMPRESSION: CT of the head: Resolving subdural hematoma along the falx and left tentorium cerebelli. No acute hemorrhage is noted.  Diffuse atrophic and chronic ischemic changes stable from the prior exam.  CT of the cervical spine: Multilevel degenerative change without acute abnormality.  __________________________________________  PROCEDURES  Procedure(s) performed: None  Critical Care performed: None  ____________________________________________   ED COURSE / ASSESSMENT AND PLAN  Pertinent labs & imaging results that were available during my care of the patient were reviewed by me and considered in my medical decision making (see chart for details).   Johnathan Whitney is here for evaluation after being found on the floor, likely a fall from his bed. He doesn't have a specific complaints, but on exam he does seem to be complaining of bit of neck pain. He currently has a history of chronic neck pain, however given the unwitnessed fall and his dementia, unable to provide any additional history, I am imaging head and C-spine. He is a little slow to answer questions, not sure if this is completely is baseline or if it's just early in the morning.  CT head reviewed by me, and I also spoke with the reading radiologist, there is a subdural hematoma that was acute appearing on the CT scan about one week ago, and appears to  be improving on today's scan. There is also a subdural hygroma right post anterior/parietal area which is similar from the previous.  I spoke with the patient's daughter regarding the small and now resolving subdural hematoma, and that at this point in time risk versus benefit of being on full strength aspirin seems to be on the side of stopping the aspirin. She understands the risk of cardiovascular event such as heart attack or stroke, however at this point in time we have known fall risk with dementia and head bleed.  I spoke with her about obtaining a urinalysis and laboratory studies for medical evaluation, and she was okay with this.  Laboratory evaluation shows very slight decreased renal function, I will give a fluid bolus here. White blood cell count is slightly lower than previous, uncertain etiology there. Hemoglobin is stable.  Troponin 0.06. I will go ahead and repeat this. EKG is nonspecific. Again I'm highly concerned about remaining on aspirin due to the head bleed and he is a known fall risk.  Repeat troponin, improved/decreasing.  We'll plan for discharge back to the nursing home.    CONSULTATIONS:  None Patient / Family / Caregiver informed of clinical course, medical decision-making process, and agree with plan.   I discussed return precautions, follow-up instructions, and discharge instructions with patient and/or family.   ___________________________________________   FINAL CLINICAL IMPRESSION(S) / ED DIAGNOSES   Final diagnoses:  Fall, initial encounter  Subacute subdural hematoma ([place])  Subdural hygroma              Note: This dictation was prepared with Dragon dictation. Any transcriptional errors that result from this process are unintentional    Lisa Roca, MD 08/08/16 1534

## 2016-08-08 NOTE — ED Notes (Signed)
Pt on stretcher in nad with eyes closed.  Says no pain, but says just tired.

## 2016-08-08 NOTE — ED Notes (Signed)
Anderson Malta, supervisor med tech from [employer], called for update about patient.  Call back number is [phone].

## 2016-08-08 NOTE — ED Notes (Signed)
Patient attempting to get out of bed.  Patient redirected back to bed by this nurse and Shelda Pal, RN.  Offered bathroom with no results at this time.  Posey alarm in place under patient, fall mat placed at end of bed, patient given call bell with instructions to use, curtain open to monitor patient.

## 2016-08-08 NOTE — Discharge Instructions (Signed)
Stop aspirin  Return to the emergency department immediately for any worsening condition including altered mental status from baseline, fever, weakness, numbness, confusion, chest pain, trouble breathing, abdominal pain, black or bloody stools, or any other symptoms concerning to you.

## 2016-09-03 ENCOUNTER — Emergency Department
Admission: EM | Admit: 2016-09-03 | Discharge: 2016-09-04 | Disposition: A | Discharge status: Home / Self Care | Payer: Federal, State, Local not specified - PPO | Source: Home / Self Care | Attending: Student in an Organized Health Care Education/Training Program | Admitting: Student in an Organized Health Care Education/Training Program

## 2016-09-03 ENCOUNTER — Emergency Department: Payer: Federal, State, Local not specified - PPO

## 2016-09-03 ENCOUNTER — Encounter: Payer: Self-pay | Admitting: Emergency Medicine

## 2016-09-03 DIAGNOSIS — Z79899 Other long term (current) drug therapy: Secondary | ICD-10-CM | POA: Insufficient documentation

## 2016-09-03 DIAGNOSIS — Y929 Unspecified place or not applicable: Secondary | ICD-10-CM | POA: Diagnosis not present

## 2016-09-03 DIAGNOSIS — S0990XA Unspecified injury of head, initial encounter: Secondary | ICD-10-CM | POA: Diagnosis present

## 2016-09-03 DIAGNOSIS — I509 Heart failure, unspecified: Secondary | ICD-10-CM

## 2016-09-03 DIAGNOSIS — Y999 Unspecified external cause status: Secondary | ICD-10-CM | POA: Diagnosis not present

## 2016-09-03 DIAGNOSIS — R4182 Altered mental status, unspecified: Secondary | ICD-10-CM

## 2016-09-03 DIAGNOSIS — Y939 Activity, unspecified: Secondary | ICD-10-CM | POA: Diagnosis not present

## 2016-09-03 DIAGNOSIS — W19XXXA Unspecified fall, initial encounter: Secondary | ICD-10-CM | POA: Insufficient documentation

## 2016-09-03 DIAGNOSIS — S0101XA Laceration without foreign body of scalp, initial encounter: Secondary | ICD-10-CM | POA: Insufficient documentation

## 2016-09-03 DIAGNOSIS — I959 Hypotension, unspecified: Secondary | ICD-10-CM

## 2016-09-03 DIAGNOSIS — E860 Dehydration: Secondary | ICD-10-CM

## 2016-09-03 DIAGNOSIS — I6200 Nontraumatic subdural hemorrhage, unspecified: Secondary | ICD-10-CM | POA: Insufficient documentation

## 2016-09-03 LAB — URINALYSIS, COMPLETE (UACMP) WITH MICROSCOPIC
BACTERIA UA: NONE SEEN
Bilirubin Urine: NEGATIVE
Glucose, UA: NEGATIVE mg/dL
Ketones, ur: NEGATIVE mg/dL
Leukocytes, UA: NEGATIVE
NITRITE: NEGATIVE
Protein, ur: NEGATIVE mg/dL
Specific Gravity, Urine: 1.018 (ref 1.005–1.030)
pH: 5 (ref 5.0–8.0)

## 2016-09-03 LAB — CBC
HCT: 35.2 % — ABNORMAL LOW (ref 40.0–52.0)
Hemoglobin: 12 g/dL — ABNORMAL LOW (ref 13.0–18.0)
MCH: 31.9 pg (ref 26.0–34.0)
MCHC: 34.2 g/dL (ref 32.0–36.0)
MCV: 93.2 fL (ref 80.0–100.0)
PLATELETS: 236 10*3/uL (ref 150–440)
RBC: 3.77 MIL/uL — ABNORMAL LOW (ref 4.40–5.90)
RDW: 14.5 % (ref 11.5–14.5)
WBC: 6.3 10*3/uL (ref 3.8–10.6)

## 2016-09-03 LAB — COMPREHENSIVE METABOLIC PANEL
ALBUMIN: 3 g/dL — AB (ref 3.5–5.0)
ALK PHOS: 46 U/L (ref 38–126)
ALT: 15 U/L — AB (ref 17–63)
AST: 36 U/L (ref 15–41)
Anion gap: 10 (ref 5–15)
BUN: 19 mg/dL (ref 6–20)
CALCIUM: 8.3 mg/dL — AB (ref 8.9–10.3)
CHLORIDE: 102 mmol/L (ref 101–111)
CO2: 25 mmol/L (ref 22–32)
CREATININE: 1.11 mg/dL (ref 0.61–1.24)
GFR calc Af Amer: >60 mL/min (ref >60)
GFR calc non Af Amer: 60 mL/min — ABNORMAL LOW (ref >60)
GLUCOSE: 193 mg/dL — AB (ref 65–99)
Potassium: 3.4 mmol/L — ABNORMAL LOW (ref 3.5–5.1)
SODIUM: 137 mmol/L (ref 135–145)
Total Bilirubin: 0.6 mg/dL (ref 0.3–1.2)
Total Protein: 6.7 g/dL (ref 6.5–8.1)

## 2016-09-03 LAB — INFLUENZA PANEL BY PCR (TYPE A & B)
INFLAPCR: NEGATIVE
INFLBPCR: NEGATIVE

## 2016-09-03 LAB — TROPONIN I: Troponin I: 0.05 ng/mL (ref <0.03)

## 2016-09-03 LAB — LACTIC ACID, PLASMA
LACTIC ACID, VENOUS: 2.9 mmol/L — AB (ref 0.5–1.9)
LACTIC ACID, VENOUS: 2.7 mmol/L — AB (ref 0.5–1.9)

## 2016-09-03 MED ORDER — SODIUM CHLORIDE 0.9 % IV BOLUS (SEPSIS)
500.0000 mL | Freq: Once | INTRAVENOUS | Status: AC
  Administered 2016-09-03: 500 mL via INTRAVENOUS

## 2016-09-03 NOTE — Discharge Instructions (Addendum)
As we discussed, we believe your symptoms are caused today by mild volume depletion, or mild dehydration, without any evidence of damage to your body.  Please drink plenty of clear fluids such as water and/or Gatorade and follow up with your regular doctor or the doctors listed in his documentation at the next available opportunity.  Return to the emergency department with any new or worsening symptoms that concern you, including but not limited to fever, shortness of breath, chest pain, or other concerning symptoms. 

## 2016-09-03 NOTE — ED Provider Notes (Signed)
[facility] Emergency Department Provider Note    First MD Initiated Contact with Patient 09/03/16 1930     (approximate)  I have reviewed the triage vital signs and the nursing notes.   HISTORY  Chief Complaint Altered Mental Status  Level V Caveat:  dementia  HPI Johnathan Whitney is a 83 y.o. male presents from [place] via EMS for complaint of declining mental health over the past 2-3 week.   Patient was recently seen in the ER for fall had evidence of subacute subdural hematoma.. Since then no report of additional falls. No fevers. States that he did have a cough 5 days ago. Patient with severe underlying dementia that the nursing staff told EMS is currently at baseline. He cannot provide any additional history.   Past Medical History:  Diagnosis Date  . CHF (congestive heart failure) ([place])   . Dementia   . Elevated AST (SGOT)   . Elevated serum creatinine   . Falls frequently   . GERD (gastroesophageal reflux disease)   . Gout   . Hyperlipidemia   . Hyperlipidemia    Family History  Problem Relation Age of Onset  . Diabetes Neg Hx   . Hypertension Neg Hx    Past Surgical History:  Procedure Laterality Date  . none     Patient Active Problem List   Diagnosis Date Noted  . Syncope and collapse 08/02/2016  . Syncope 08/02/2016      Prior to Admission medications   Medication Sig Start Date End Date Taking? Authorizing Provider  acetaminophen (TYLENOL) 500 MG tablet Take 500 mg by mouth 2 (two) times daily as needed.   Yes Historical Provider, MD  cholecalciferol (VITAMIN D) 1000 units tablet Take 1,000 Units by mouth daily.   Yes Historical Provider, MD  citalopram (CELEXA) 20 MG tablet Take 20 mg by mouth daily.   Yes Historical Provider, MD  divalproex (DEPAKOTE SPRINKLE) 125 MG capsule Take 125 mg by mouth 3 (three) times daily.   Yes Historical Provider, MD  donepezil (ARICEPT) 10 MG tablet Take 10 mg by mouth at bedtime.    Yes Historical Provider, MD  esomeprazole (NEXIUM) 40 MG capsule Take 40 mg by mouth at bedtime.   Yes Historical Provider, MD  furosemide (LASIX) 20 MG tablet Take 20 mg by mouth daily.   Yes Historical Provider, MD  memantine (NAMENDA) 10 MG tablet Take 10 mg by mouth 2 (two) times daily.   Yes Historical Provider, MD  metoprolol succinate (TOPROL-XL) 50 MG 24 hr tablet Take 50 mg by mouth daily. Take with or immediately following a meal.   Yes Historical Provider, MD  nitroGLYCERIN (NITROSTAT) 0.4 MG SL tablet Place 0.4 mg under the tongue every 5 (five) minutes as needed for chest pain.   Yes Historical Provider, MD  pravastatin (PRAVACHOL) 40 MG tablet Take 40 mg by mouth daily.   Yes Historical Provider, MD  risperiDONE (RISPERDAL) 1 MG tablet Take 1 mg by mouth 2 (two) times daily.   Yes Historical Provider, MD  traZODone (DESYREL) 100 MG tablet Take 100 mg by mouth at bedtime as needed for sleep.   Yes Historical Provider, MD  vitamin B-12 (CYANOCOBALAMIN) 1000 MCG tablet Take 1,000 mcg by mouth daily.   Yes Historical Provider, MD  aspirin 325 MG tablet Take 325 mg by mouth daily.    Historical Provider, MD    Allergies Patient has no known allergies.    Social History Social History  Substance Use Topics  . Smoking status: Never Smoker  . Smokeless tobacco: Never Used  . Alcohol use No    Review of Systems Patient denies headaches, rhinorrhea, blurry vision, numbness, shortness of breath, chest pain, edema, cough, abdominal pain, nausea, vomiting, diarrhea, dysuria, fevers, rashes or hallucinations unless otherwise stated above in HPI. ____________________________________________   PHYSICAL EXAM:  VITAL SIGNS: Vitals:   09/03/16 1933 09/03/16 2019  BP: (!) 106/56 (!) 95/44  Pulse: 67 (!) 59  Resp: (!) 22 16  Temp: 99.4 F (37.4 C)     Constitutional: Alert chronically ill appearing and in no acute distress. Eyes: Conjunctivae are normal. PERRL. EOMI. Head:  Atraumatic. Nose: No congestion/rhinnorhea. Mouth/Throat: Mucous membranes are moist.  Oropharynx non-erythematous. Neck: No stridor. Painless ROM. No cervical spine tenderness to palpation Hematological/Lymphatic/Immunilogical: No cervical lymphadenopathy. Cardiovascular: Normal rate, regular rhythm. Grossly normal heart sounds.  Good peripheral circulation. Respiratory: Normal respiratory effort.  No retractions. Lungs CTAB. Gastrointestinal: Soft and nontender. No distention. No abdominal bruits. No CVA tenderness. Musculoskeletal: No lower extremity tenderness nor edema.  No joint effusions. Neurologic:  Slow speech, uncooperative with neuro exam.  No lateralizing signs or symptoms. Skin:  Skin is warm, dry and intact. No rash noted.  ____________________________________________   LABS (all labs ordered are listed, but only abnormal results are displayed)  Results for orders placed or performed during the hospital encounter of 09/03/16 (from the past 24 hour(s))  Comprehensive metabolic panel     Status: Abnormal   Collection Time: 09/03/16  7:35 PM  Result Value Ref Range   Sodium 137 135 - 145 mmol/L   Potassium 3.4 (L) 3.5 - 5.1 mmol/L   Chloride 102 101 - 111 mmol/L   CO2 25 22 - 32 mmol/L   Glucose, Bld 193 (H) 65 - 99 mg/dL   BUN 19 6 - 20 mg/dL   Creatinine, Ser 1.11 0.61 - 1.24 mg/dL   Calcium 8.3 (L) 8.9 - 10.3 mg/dL   Total Protein 6.7 6.5 - 8.1 g/dL   Albumin 3.0 (L) 3.5 - 5.0 g/dL   AST 36 15 - 41 U/L   ALT 15 (L) 17 - 63 U/L   Alkaline Phosphatase 46 38 - 126 U/L   Total Bilirubin 0.6 0.3 - 1.2 mg/dL   GFR calc non Af Amer 60 (L) >60 mL/min   GFR calc Af Amer >60 >60 mL/min   Anion gap 10 5 - 15  CBC     Status: Abnormal   Collection Time: 09/03/16  7:35 PM  Result Value Ref Range   WBC 6.3 3.8 - 10.6 K/uL   RBC 3.77 (L) 4.40 - 5.90 MIL/uL   Hemoglobin 12.0 (L) 13.0 - 18.0 g/dL   HCT 35.2 (L) 40.0 - 52.0 %   MCV 93.2 80.0 - 100.0 fL   MCH 31.9 26.0 -  34.0 pg   MCHC 34.2 32.0 - 36.0 g/dL   RDW 14.5 11.5 - 14.5 %   Platelets 236 150 - 440 K/uL  Troponin I     Status: Abnormal   Collection Time: 09/03/16  7:35 PM  Result Value Ref Range   Troponin I 0.05 (HH) <0.03 ng/mL   ____________________________________________  EKG My review and personal interpretation at Time: 19:34   Indication: ams  Rate: 70  Rhythm: sinus Axis: normal Other: no acute STEMI or depressions, normal intervals ____________________________________________  RADIOLOGY  I personally reviewed all radiographic images ordered to evaluate for the above acute complaints and reviewed radiology  reports and findings.  These findings were personally discussed with the patient.  Please see medical record for radiology report.  ____________________________________________   PROCEDURES  Procedure(s) performed:  Procedures    Critical Care performed: no ____________________________________________   INITIAL IMPRESSION / ASSESSMENT AND PLAN / ED COURSE  Pertinent labs & imaging results that were available during my care of the patient were reviewed by me and considered in my medical decision making (see chart for details).  DDX: Dehydration, sepsis, pna, uti, hypoglycemia, cva, drug effect, withdrawal, encephalitis   Johnathan Whitney is a 83 y.o. who presents to the ED with report of changing her declining mental health over the past 2-3 weeks. Patient with a history of dementia. Otherwise afebrile and hemodynamically stable. Blood work is reassuring and currently at his baseline. CT imaging of the head repeated today as he did have evidence of subdural hematoma previously. That appears to resolved. No lateralizing features to suggest CVA. Chest x-ray without acute abnormality.  Clinical Course as of Sep 03 2152  Tue Sep 03, 2016  2132 Patient reassessed. Blood pressure improving with IV fluids. No evidence of fever. No leukocytosis. Flu is pending but  urinalysis shows no evidence of infection. At this point we will repeat lactic acid is a do suspect a lot of this is secondary to dehydration. No other evidence of Sirs criteria to suggest systemic infection. We'll put an repeat lactic acid. If improved he feel patient is stable for discharge back to facility. I spoke with the patient's daughter, Johnathan Whitney. No report of fever at home. He did have cough several weeks ago but otherwise acting at baseline. Discussed my concern regarding the elevated lactic acid and that we will be repeating this but there is no other evidence of infection. She states that he has had decreased oral intake and would expect some component of dehydration.  Would feel comfortable with him returning back to facility if remainder of  Workup in ER is unremarkable. Case discussed with Dr. Clearnce Hasten who agrees to follow up on result of repeat lactic acid.  [PR]    Clinical Course User Index [PR] Merlyn Lot, MD     ____________________________________________   FINAL CLINICAL IMPRESSION(S) / ED DIAGNOSES  Final diagnoses:  Hypotension, unspecified hypotension type  Altered mental status, unspecified altered mental status type  Dehydration      NEW MEDICATIONS STARTED DURING THIS VISIT:  New Prescriptions   No medications on file     Note:  This document was prepared using Dragon voice recognition software and may include unintentional dictation errors.    Merlyn Lot, MD 09/03/16 2156

## 2016-09-03 NOTE — ED Triage Notes (Signed)
Patient from [facility] via [place] for c/o AMS. Patient has h/o dementia (at baseline currently per staff to EMS, but health has been declining over last 2-3 weeks). Per staff at [facility], patient was up and ambulatory 2-3 weeks ago, no longer ambulatory and less talkative. Patient in no acute distress upon arrival.

## 2016-09-04 ENCOUNTER — Emergency Department: Payer: Federal, State, Local not specified - PPO

## 2016-09-04 ENCOUNTER — Emergency Department
Admission: EM | Admit: 2016-09-04 | Discharge: 2016-09-04 | Payer: Federal, State, Local not specified - PPO | Attending: Emergency Medicine | Admitting: Emergency Medicine

## 2016-09-04 DIAGNOSIS — S0083XA Contusion of other part of head, initial encounter: Secondary | ICD-10-CM

## 2016-09-04 DIAGNOSIS — S0101XA Laceration without foreign body of scalp, initial encounter: Secondary | ICD-10-CM | POA: Diagnosis not present

## 2016-09-04 DIAGNOSIS — S065XAA Traumatic subdural hemorrhage with loss of consciousness status unknown, initial encounter: Secondary | ICD-10-CM

## 2016-09-04 DIAGNOSIS — S065X9A Traumatic subdural hemorrhage with loss of consciousness of unspecified duration, initial encounter: Secondary | ICD-10-CM

## 2016-09-04 LAB — CBC WITH DIFFERENTIAL/PLATELET
Basophils Absolute: 0 10*3/uL (ref 0–0.1)
Basophils Relative: 1 %
EOS ABS: 0 10*3/uL (ref 0–0.7)
Eosinophils Relative: 0 %
HEMATOCRIT: 34.9 % — AB (ref 40.0–52.0)
HEMOGLOBIN: 12.2 g/dL — AB (ref 13.0–18.0)
LYMPHS ABS: 0.6 10*3/uL — AB (ref 1.0–3.6)
Lymphocytes Relative: 9 %
MCH: 32.4 pg (ref 26.0–34.0)
MCHC: 35 g/dL (ref 32.0–36.0)
MCV: 92.7 fL (ref 80.0–100.0)
MONO ABS: 0.5 10*3/uL (ref 0.2–1.0)
MONOS PCT: 8 %
NEUTROS ABS: 5.7 10*3/uL (ref 1.4–6.5)
Neutrophils Relative %: 82 %
Platelets: 207 10*3/uL (ref 150–440)
RBC: 3.77 MIL/uL — ABNORMAL LOW (ref 4.40–5.90)
RDW: 14.2 % (ref 11.5–14.5)
WBC: 6.9 10*3/uL (ref 3.8–10.6)

## 2016-09-04 LAB — BASIC METABOLIC PANEL
ANION GAP: 7 (ref 5–15)
BUN: 17 mg/dL (ref 6–20)
CALCIUM: 8.3 mg/dL — AB (ref 8.9–10.3)
CO2: 28 mmol/L (ref 22–32)
Chloride: 108 mmol/L (ref 101–111)
Creatinine, Ser: 0.98 mg/dL (ref 0.61–1.24)
GFR calc Af Amer: >60 mL/min (ref >60)
GLUCOSE: 90 mg/dL (ref 65–99)
POTASSIUM: 3.2 mmol/L — AB (ref 3.5–5.1)
SODIUM: 143 mmol/L (ref 135–145)

## 2016-09-04 LAB — LACTIC ACID, PLASMA: LACTIC ACID, VENOUS: 1.5 mmol/L (ref 0.5–1.9)

## 2016-09-04 LAB — GLUCOSE, CAPILLARY: GLUCOSE-CAPILLARY: 104 mg/dL — AB (ref 65–99)

## 2016-09-04 MED ORDER — MORPHINE SULFATE (PF) 2 MG/ML IV SOLN
INTRAVENOUS | Status: AC
  Administered 2016-09-04: 2 mg via INTRAVENOUS
  Filled 2016-09-04: qty 1

## 2016-09-04 MED ORDER — MORPHINE SULFATE (PF) 2 MG/ML IV SOLN
2.0000 mg | Freq: Once | INTRAVENOUS | Status: AC
  Administered 2016-09-04: 2 mg via INTRAVENOUS

## 2016-09-04 NOTE — ED Provider Notes (Signed)
Signout from Dr. Quentin Cornwall in this 83 year old male with possible dehydration. Plan is to repeat the lactic acid after fluids.  Physical Exam  BP (!) 156/69 (BP Location: Left Arm)   Pulse 80   Temp 99.1 F (37.3 C) (Oral)   Resp (!) 22   Ht 5\' 4"  (1.626 m)   Wt 120 lb (54.4 kg)   SpO2 98%   BMI 20.60 kg/m   Physical Exam Pending physical exam. ED Course  Procedures  MDM ----------------------------------------- 1140 pM on 09/03/2016 -----------------------------------------  Lactic acid was drawn before fluid bolus was completed. Lactic gas will be redrawn and reassessed. Signed out to Dr. Karma Greaser.       Orbie Pyo, MD 09/04/16 [phone]

## 2016-09-04 NOTE — ED Notes (Signed)
Patient diaphoretic upon this RN entering room. Patient's CBG checked (104), repeat EKG performed, and patient's vitals rechecked. All were within normal limits. Dr. Karma Greaser notified.

## 2016-09-04 NOTE — ED Triage Notes (Addendum)
Pt presents to ED via EMS from [facility] after he had an unwitnessed fall while sitting on the side of the bed. Skin tear to forehead noted. Bleeding controlled. Pt not answering questions. Eyes open and even unlabored respirations. No distress noted. No other apparent injury. Pt behavior at baseline per EMS per EMS. Pt seen, treated and released earlier this evening for AMS.

## 2016-09-04 NOTE — ED Provider Notes (Signed)
[facility] Emergency Department Provider Note ____________________________________________   I have reviewed the triage vital signs and the triage nursing note.  HISTORY  Chief Complaint Fall   Historian Limited history - patient with dementia, poor historian  HPI Johnathan Whitney is a 83 y.o. male from nursing home, with history of dementia and frequent falls, history of subdural hematoma and history or evaluation in the ER yesterday for(review of chart) declining mental status for a few weeks.  He was found to have chronically minimally elevated troponin, and elevated lactate that resolved with fluids, one low bp felt likely have been possible error, but in any case, was discharged with stable vital signs and workup not suspicious for infection.  He apparently was found by nursing home staff with forehead hematoma, after an unwitnessed fall.  Patient is only really answering yes or no questions, and does say yes when asked if his neck hurts. He says he has if I ask if his head hurts. He says no when I ask him over the remainder of his body including extremities and abdomen and back.    Past Medical History:  Diagnosis Date  . CHF (congestive heart failure) ([place])   . Dementia   . Elevated AST (SGOT)   . Elevated serum creatinine   . Falls frequently   . GERD (gastroesophageal reflux disease)   . Gout   . Hyperlipidemia   . Hyperlipidemia     Patient Active Problem List   Diagnosis Date Noted  . Syncope and collapse 08/02/2016  . Syncope 08/02/2016    Past Surgical History:  Procedure Laterality Date  . none      Prior to Admission medications   Medication Sig Start Date End Date Taking? Authorizing Provider  acetaminophen (TYLENOL) 500 MG tablet Take 500 mg by mouth 2 (two) times daily as needed.   Yes Historical Provider, MD  cholecalciferol (VITAMIN D) 1000 units tablet Take 1,000 Units by mouth daily.   Yes Historical Provider, MD   citalopram (CELEXA) 20 MG tablet Take 20 mg by mouth daily.   Yes Historical Provider, MD  divalproex (DEPAKOTE SPRINKLE) 125 MG capsule Take 125 mg by mouth 3 (three) times daily.   Yes Historical Provider, MD  donepezil (ARICEPT) 10 MG tablet Take 10 mg by mouth at bedtime.   Yes Historical Provider, MD  esomeprazole (NEXIUM) 40 MG capsule Take 40 mg by mouth at bedtime.   Yes Historical Provider, MD  furosemide (LASIX) 20 MG tablet Take 20 mg by mouth daily.   Yes Historical Provider, MD  memantine (NAMENDA) 10 MG tablet Take 10 mg by mouth 2 (two) times daily.   Yes Historical Provider, MD  metoprolol succinate (TOPROL-XL) 50 MG 24 hr tablet Take 50 mg by mouth daily. Take with or immediately following a meal.   Yes Historical Provider, MD  nitroGLYCERIN (NITROSTAT) 0.4 MG SL tablet Place 0.4 mg under the tongue every 5 (five) minutes as needed for chest pain.   Yes Historical Provider, MD  permethrin (NIX) 1 % liquid Apply 1 application topically once. Wash hair with shampoo (no conditioner), saturate damp hair with Nix, then leave in for 10 minutes (protect eyes). Use nit comb to remove nits. Repeat in 7 days if live lice present.   Yes Historical Provider, MD  pravastatin (PRAVACHOL) 40 MG tablet Take 40 mg by mouth daily.   Yes Historical Provider, MD  risperiDONE (RISPERDAL) 1 MG tablet Take 1 mg by mouth 2 (two)  times daily.   Yes Historical Provider, MD  traZODone (DESYREL) 100 MG tablet Take 100 mg by mouth at bedtime as needed for sleep.   Yes Historical Provider, MD  vitamin B-12 (CYANOCOBALAMIN) 1000 MCG tablet Take 1,000 mcg by mouth daily.   Yes Historical Provider, MD    No Known Allergies  Family History  Problem Relation Age of Onset  . Diabetes Neg Hx   . Hypertension Neg Hx     Social History Social History  Substance Use Topics  . Smoking status: Never Smoker  . Smokeless tobacco: Never Used  . Alcohol use No    Review of Systems Unable to obtain due to  dementia.   Yes to head pain and neck pain  ____________________________________________   PHYSICAL EXAM:  VITAL SIGNS: ED Triage Vitals  Enc Vitals Group     BP 09/04/16 0646 (!) 146/93     Pulse Rate 09/04/16 0646 (!) 57     Resp 09/04/16 0646 20     Temp 09/04/16 0646 98.4 F (36.9 C)     Temp Source 09/04/16 0646 Oral     SpO2 09/04/16 0646 98 %     Weight 09/04/16 0647 125 lb (56.7 kg)     Height 09/04/16 0647 5\' 4"  (1.626 m)     Head Circumference --      Peak Flow --      Pain Score --      Pain Loc --      Pain Edu? --      Excl. in [place]? --      Constitutional: Alert and cooperative, but somewhat slow to answer only yes no questions. Dry mouth. HEENT   Head: Hematoma left forehead with skin tear.  Left scalp laceration.      Eyes: Conjunctivae are normal. PERRL. Normal extraocular movements.      Ears:         Nose: No congestion/rhinnorhea.   Mouth/Throat: Mucous membranes are mildly dry.   Neck: No stridor. Cardiovascular/Chest: Normal rate, regular rhythm.  No murmurs, rubs, or gallops. Respiratory: Normal respiratory effort without tachypnea nor retractions. Breath sounds are clear and equal bilaterally. No wheezes/rales/rhonchi. Gastrointestinal: Soft. No distention, no guarding, no rebound. Nontender.  Genitourinary/rectal:Deferred Musculoskeletal: Nontender with normal range of motion in all extremities. No joint effusions.  No lower extremity tenderness.  No edema. Neurologic:  Only says yes and no.  No facial droop.  Follows most commands,although slowly.  No gross or focal neurologic deficits are appreciated.  He is limited to full neuro exam due to cooperation. Skin:  Skin is warm, dry and intact. No rash noted. Psychiatric: Flat affect. No agitation.   ____________________________________________  LABS (pertinent positives/negatives)  Labs Reviewed  BASIC METABOLIC PANEL - Abnormal; Notable for the following:       Result Value    Potassium 3.2 (*)    Calcium 8.3 (*)    All other components within normal limits  CBC WITH DIFFERENTIAL/PLATELET - Abnormal; Notable for the following:    RBC 3.77 (*)    Hemoglobin 12.2 (*)    HCT 34.9 (*)    Lymphs Abs 0.6 (*)    All other components within normal limits    ____________________________________________    EKG I, Lisa Roca, MD, the attending physician have personally viewed and interpreted all ECGs.  54 bpm. Narrow QRS. Normal axis. Nonspecific ST and T-wave ____________________________________________  RADIOLOGY All Xrays were viewed by me. Imaging interpreted by Radiologist.  CT head  and cspine:   IMPRESSION: 5 mm acute subdural hematoma on the right has developed since yesterday. No shift of the midline structures. Negative for fracture  Negative for cervical spine fracture. Moderate degenerative changes.  These results were called by telephone at the time of interpretation on 09/04/2016 at 8:07 am to Dr. Lisa Roca , who verbally acknowledged these results. __________________________________________  PROCEDURES  Procedure(s) performed: LACERATION REPAIR Performed by: Lisa Roca Authorized by: Lisa Roca Wound explored  Laceration Location: left scalp Laceration Length: 3cm  No Foreign Bodies seen or palpated  Amount of cleaning: standard  Skin closure: staples   Number of staples - 3  Patient tolerance: Patient tolerated the procedure well with no immediate complications.   Critical Care performed: CRITICAL CARE Performed by: Lisa Roca   Total critical care time: 60 minutes  Critical care time was exclusive of separately billable procedures and treating other patients.  Critical care was necessary to treat or prevent imminent or life-threatening deterioration.  Critical care was time spent personally by me on the following activities: development of treatment plan with patient and/or surrogate as well as nursing,  discussions with consultants, evaluation of patient's response to treatment, examination of patient, obtaining history from patient or surrogate, ordering and performing treatments and interventions, ordering and review of laboratory studies, ordering and review of radiographic studies, pulse oximetry and re-evaluation of patient's condition.   ____________________________________________   ED COURSE / ASSESSMENT AND PLAN  Pertinent labs & imaging results that were available during my care of the patient were reviewed by me and considered in my medical decision making (see chart for details).   Johnathan Whitney is here for trauma eval after unwitnessed fall and hematoma with skin tear to forehead.  He answers yes to neck pain, so placed in collar and will CT through both head and neck.  No other apparent traumatic injuries.  In terms of the cause of the fall, he does have a history of frequent falls in the past unknown not in the last several weeks. He was evaluated overnight for decreasing mental status, but no serious findings or uncovered although he was treated for dehydration.  CT showing no cervical fracure, but acute subdural hematoma, no shift.  BP 140s and reviewed no low platelets yesterday's labs.  Does take aspirin, no other blood thinner. No DNR on file.    I attempted to contact daughter by phone, no answer, left message to call ER.  He seems to be at same mental status if I recall from several weeks ago when I saw him.  Will watch mental status closely.    I attempted transfer to [facility], first discussed with neurohospitalist -- recommends hospitalist admission with neurosurgery consultation.  Referred to hospitalist, then referred to neurosurgeon.  Spoke with Dr. Shelly Rubenstein, recommends given age and medical problems no transfer/neurosurgical intervention.  I consulted with Dr. Cari Caraway, [facility] Neurosurgery, reviewed scans and history over the phone.  Recommends 6 hour repeat  scan vs. Hospital observation here vs. [facility] (check and [facility] has icu beds available).   My initial conversation today with daughter who called back, Lauro Regulus is the stepdaughter, she stated that there were multiple other children that she was going to try to call and get consensus from, although she states that there is likely not consensus amongst the family members.  I did let her know there was a range of opinions in this case with ranging from discharge home, to Hospital observation without neurosurgical backup, versus  transfer to a center with neurosurgical backup.  The patient is DO NOT RESUSCITATE.  After several hours I did not receive phone call back from her and did leave another message. Based on my interaction with her, airing on the conservative treatment course, I did initiate the transfer to [facility] where the patient could have neurosurgical evaluation and treatment if he deteriorated.  Spoke with [facility], [facility] on diversion, I spoke with Trauma surgeon Dr. Doyne Keel, accepted to the ED, [facility] main [facility] ER.  Transport by [facility], emergently.  CONSULTATIONS:   Neurohospitalist [place] Dr. Shon Hale, then hospitalist and neurosurgeon.  [facility] neurosurgeon by phone.  [facility] Trauma surgeon Dr. Juleen China accepts in transfer admission.   Patient / Family / Caregiver informed of clinical course, medical decision-making process, and agree with plan.     ___________________________________________   FINAL CLINICAL IMPRESSION(S) / ED DIAGNOSES   Final diagnoses:  Subdural hematoma (HCC)  Laceration of scalp, initial encounter  Contusion of face, initial encounter              Note: This dictation was prepared with Dragon dictation. Any transcriptional errors that result from this process are unintentional    Lisa Roca, MD 09/04/16 [phone]

## 2016-09-04 NOTE — ED Notes (Signed)
Pt with left forehead skin tear with swelling under and left side head lac that edp placed 3 staples. Non-stick dressing applied over skin tear, gauze over both, and then burn net.

## 2016-09-04 NOTE — ED Provider Notes (Signed)
Clinical Course as of Sep 04 232  Tue Sep 03, 2016  2132 Patient reassessed. Blood pressure improving with IV fluids. No evidence of fever. No leukocytosis. Flu is pending but urinalysis shows no evidence of infection. At this point we will repeat lactic acid is a do suspect a lot of this is secondary to dehydration. No other evidence of Sirs criteria to suggest systemic infection. We'll put an repeat lactic acid. If improved he feel patient is stable for discharge back to facility.  Case discussed with Dr. Clearnce Hasten who agrees to follow up on result of repeat lactic acid.  [PR]  Wed Sep 04, 2016  0000 Assuming care from Dr. Clearnce Hasten.  In short, Johnathan Whitney is a 83 y.o. male with a chief complaint of AMS.  Refer to the original H&P for additional details.  The current plan of care is to repeat a lactic acid after the patient has received a full liter of fluids.  The rest of his workup was unremarkable except for the initial elevated lactic acid.  If it comes down after a liter of fluid it likely is just due to poor by mouth intake and the patient can return to the skilled nursing facility, but if it remains elevated he will likely need to be admitted.  [CF]  0009 EKG repeated by RN because the patient was sweaty and at baseline is unable to provide any history or ROS.  ED ECG REPORT I, Cortland Crehan, the attending physician, personally viewed and interpreted this ECG.  Date:  09/04/2016 EKG Time: 00:07 Rate: 72 Rhythm: normal sinus rhythm QRS Axis: normal Intervals: normal ST/T Wave abnormalities: normal Conduction Disturbances: none Narrative Interpretation: unremarkable, artifact is present but no evidence of acute ischemia   [CF]  0103 The patient's lactate has come down to 1.5 after a liter of fluids.  This is within normal limits.  He has remained hemodynamically stable; he had one low blood pressure but I suspect this was an erroneous reading because of the rest of the blood  pressures have been elevated.  He has no obvious signs or symptoms of infection and I believe that the lactic acid elevation is likely due to poor oral intake.  I will discharge him back to the skilled nursing facility for outpatient follow-up given no clear indication for admission or a treatable cause of his symptoms at this time.  Mental status is focal to assess given his chronic dementia.  [CF]    Clinical Course User Index [CF] Hinda Kehr, MD [PR] Merlyn Lot, MD      Hinda Kehr, MD 09/04/16 [phone]

## 2016-09-04 NOTE — ED Notes (Signed)
Date and time results received: 09/03/16 2017  Test: Troponin  Critical Value: 0.05  Name of Provider Notified: Quentin Cornwall  Orders Received? Or Actions Taken?:No additional actions or new orders at this time.

## 2016-09-04 NOTE — ED Notes (Signed)
Date and time results received: 09/03/16 2054 (use smartphrase ".now" to insert current time)  Test: Lactic acid Critical Value: 2.9  Name of Provider Notified: Quentin Cornwall  Orders Received? Or Actions Taken?: Awaiting new orders.

## 2016-09-04 NOTE — ED Notes (Signed)
Pt stated that his head hurts really bad. edp notified and verbal order for mso4 2mg . Med given.

## 2016-09-05 LAB — URINE CULTURE
Culture: NO GROWTH
Special Requests: NORMAL

## 2016-09-08 LAB — CULTURE, BLOOD (ROUTINE X 2)
CULTURE: NO GROWTH
Culture: NO GROWTH

## 2016-11-01 DEATH — deceased

## 2018-01-14 IMAGING — CT CT HEAD W/O CM
4 of 7 series · 13 of 47 positions shown, 14 images · non-contrast
Comparison: 08/02/2016

CLINICAL DATA: Recent fall

EXAM:
CT HEAD WITHOUT CONTRAST
CT CERVICAL SPINE WITHOUT CONTRAST
TECHNIQUE: Multidetector CT imaging of the head and cervical spine was
performed following the standard protocol without intravenous
contrast. Multiplanar CT image reconstructions of the cervical spine
were also generated.

[Series 2: head wo · axial · 0.39mm/px · z∈[-23,+27]mm · 2 of 31 slices shown, 3 images]
[im 11/31  brain]
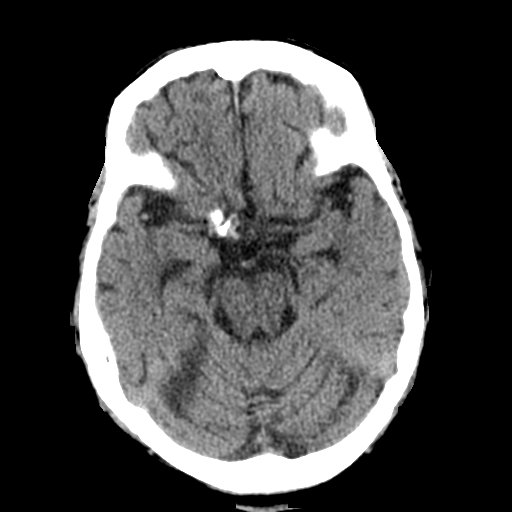
[im 11/31  bone]
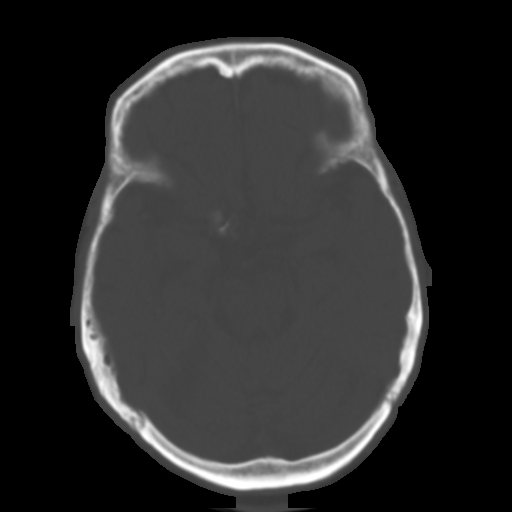
[im 21/31  brain]
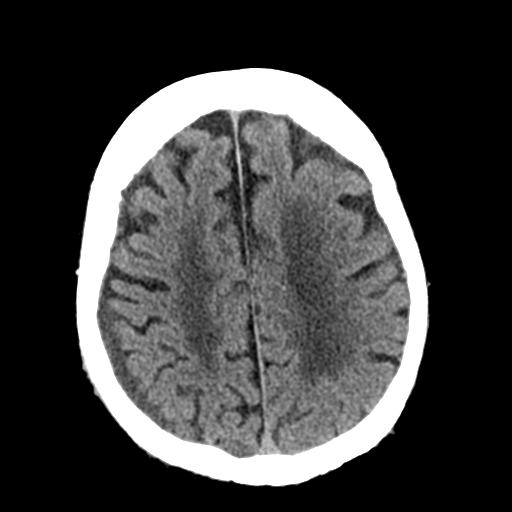

[Series 8: coronal soft tissue · coronal · 0.30mm/px · 3 of 63 slices shown]
[im 13/63  brain]
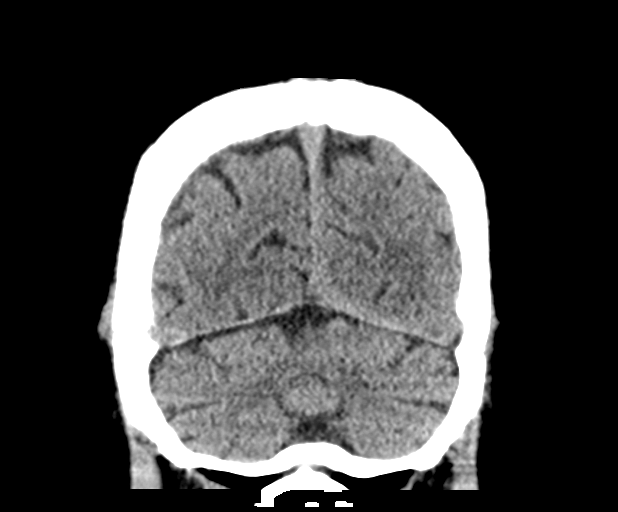
[im 25/63  brain]
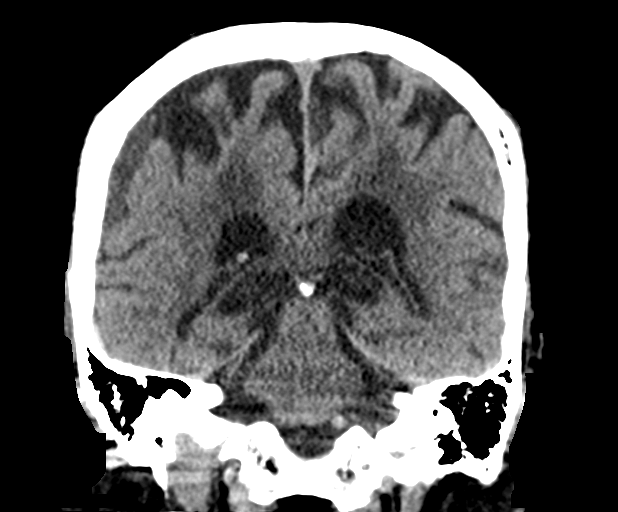
[im 38/63  brain]
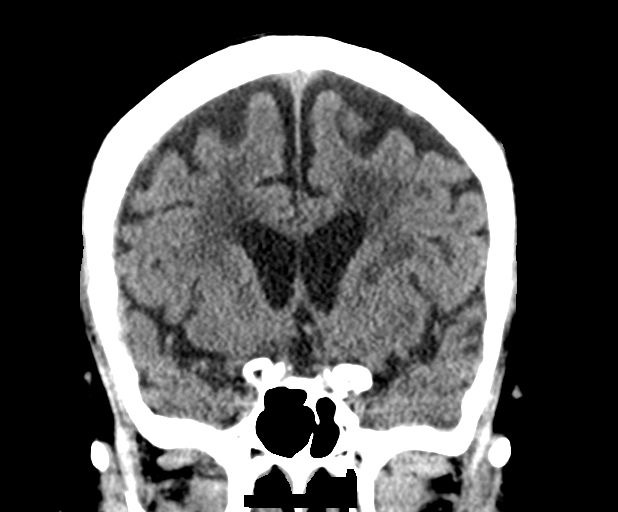

[Series 9: sagittal soft tissue · sagittal · 0.30mm/px · 2 of 54 slices shown]
[im 18/54  brain]
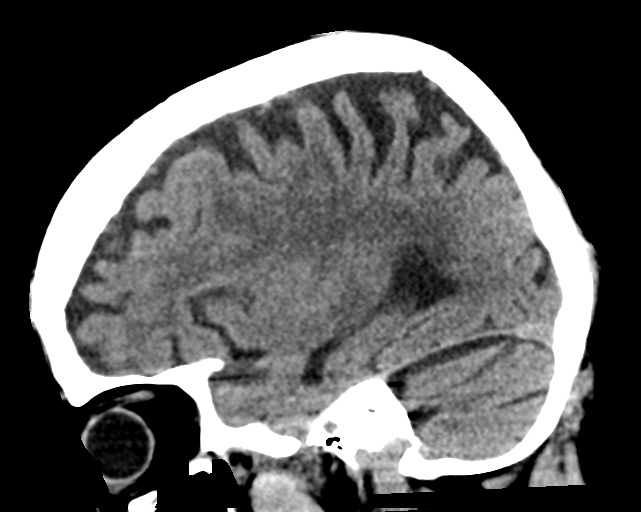
[im 36/54  brain]
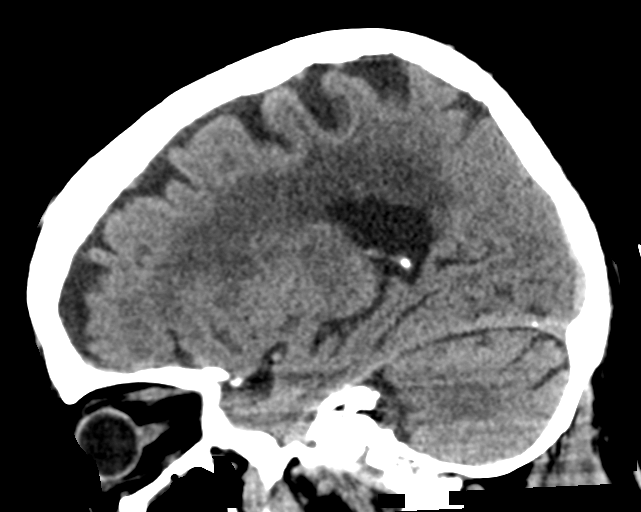

[Series 12: orthogonal bone · axial · 0.17mm/px · z∈[-261,-136]mm · 6 of 117 slices shown]
[im 8/117  bone]
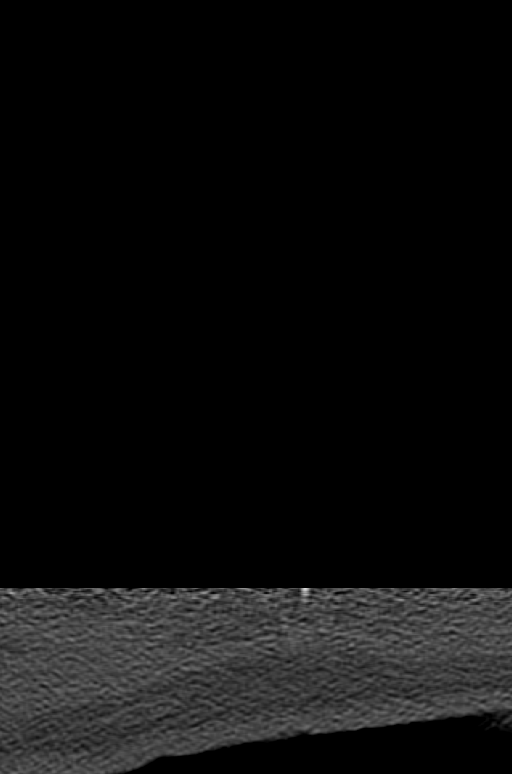
[im 24/117  bone]
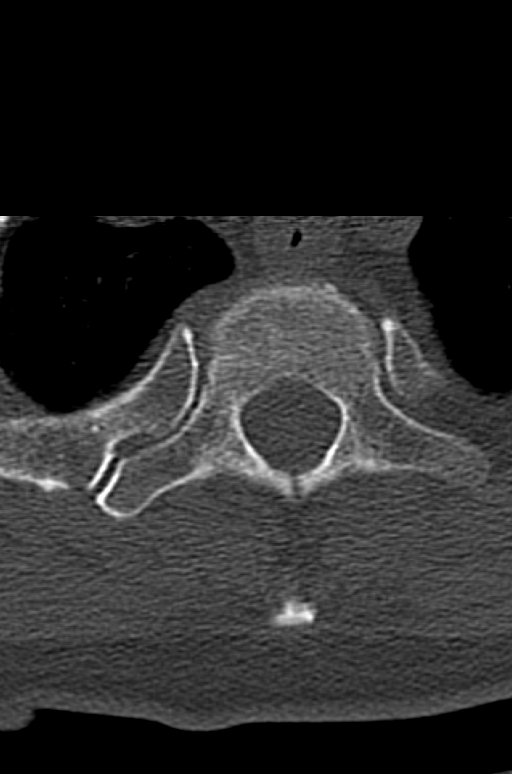
[im 39/117  bone]
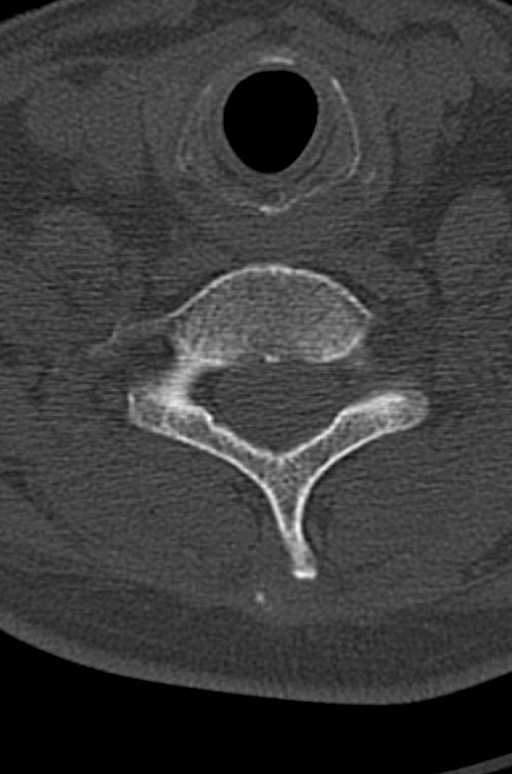
[im 55/117  bone]
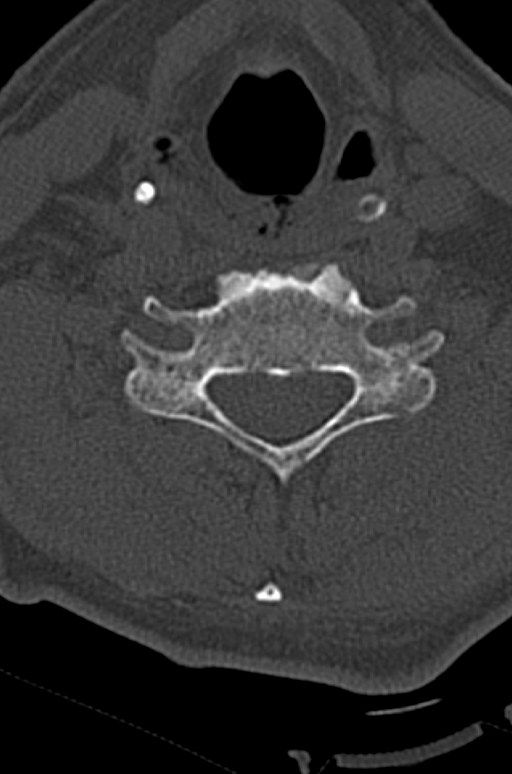
[im 62/117  bone]
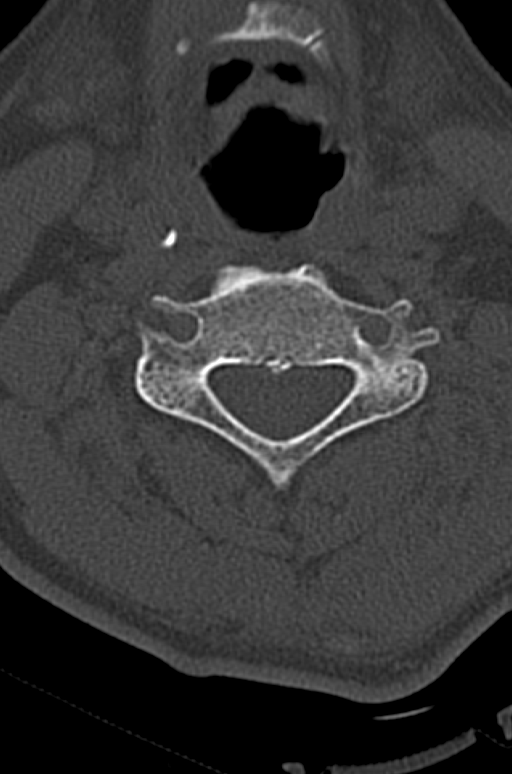
[im 78/117  bone]
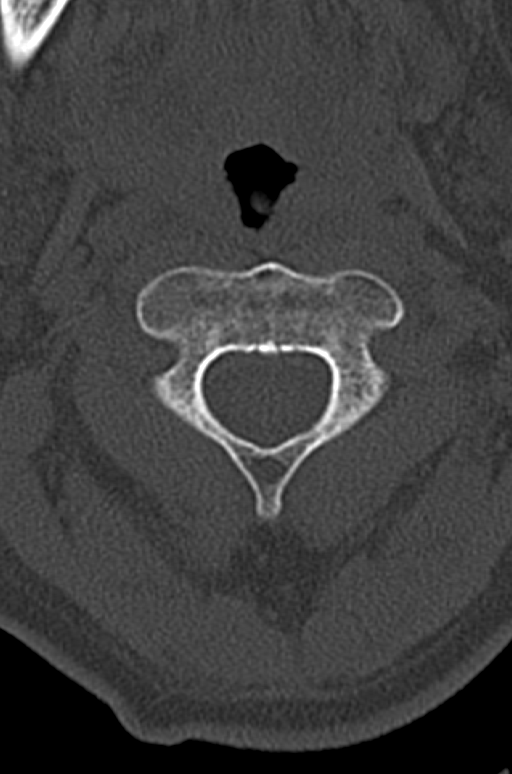

[13 of 47 positions shown; findings below may reference images not displayed]

FINDINGS: CT HEAD FINDINGS

Brain: Diffuse atrophic changes are again identified. Chronic white
matter ischemic change is seen. No acute hemorrhage, acute
infarction or space-occupying mass lesion is noted. Some thickening
is noted along the falx and tentorium on the left consistent with a
resolving subdural hematoma. No acute component is seen.

Vascular: No hyperdense vessel or unexpected calcification.

Skull: Normal. Negative for fracture or focal lesion.

Sinuses/Orbits: No acute finding.

Other: None.

CT CERVICAL SPINE FINDINGS

Alignment: Mild loss of the normal cervical lordosis. This may be
related to muscular spasm.

Skull base and vertebrae: 7 cervical segments are well visualized.
Vertebral body height is well maintained. Multilevel osteophytic
changes as well as facet hypertrophic changes are seen. No acute
fracture or acute facet abnormality is noted

Soft tissues and spinal canal: Within normal limits.

Disc levels:  Multilevel disc space narrowing is noted from C3-C7.

Upper chest: Within normal limits.

Other: None
IMPRESSION: CT of the head: Resolving subdural hematoma along the falx and left
tentorium cerebelli. No acute hemorrhage is noted.

Diffuse atrophic and chronic ischemic changes stable from the prior
exam.

CT of the cervical spine: Multilevel degenerative change without
acute abnormality.
